# Patient Record
Sex: Male | Born: 1955 | Race: Asian | Hispanic: No | Marital: Married | Smoking: Never smoker
Health system: Southern US, Community
[De-identification: ages and names within clinical notes are randomized; demographics above are authoritative.]

## PROBLEM LIST (undated history)

## (undated) DIAGNOSIS — I10 Essential (primary) hypertension: Secondary | ICD-10-CM

## (undated) DIAGNOSIS — E039 Hypothyroidism, unspecified: Secondary | ICD-10-CM

## (undated) DIAGNOSIS — I639 Cerebral infarction, unspecified: Secondary | ICD-10-CM

## (undated) DIAGNOSIS — E119 Type 2 diabetes mellitus without complications: Secondary | ICD-10-CM

## (undated) DIAGNOSIS — E785 Hyperlipidemia, unspecified: Secondary | ICD-10-CM

## (undated) HISTORY — DX: Essential (primary) hypertension: I10

## (undated) HISTORY — DX: Hypothyroidism, unspecified: E03.9

## (undated) HISTORY — DX: Cerebral infarction, unspecified: I63.9

## (undated) HISTORY — DX: Hyperlipidemia, unspecified: E78.5

## (undated) HISTORY — DX: Type 2 diabetes mellitus without complications: E11.9

---

## 2021-02-22 ENCOUNTER — Encounter: Payer: Self-pay | Admitting: Nurse Practitioner

## 2021-02-22 ENCOUNTER — Other Ambulatory Visit: Payer: Self-pay

## 2021-02-22 ENCOUNTER — Ambulatory Visit (INDEPENDENT_AMBULATORY_CARE_PROVIDER_SITE_OTHER): Payer: Medicare Other | Admitting: Nurse Practitioner

## 2021-02-22 VITALS — BP 121/66 | HR 76 | Temp 97.5°F | Ht 60.0 in | Wt 102.0 lb

## 2021-02-22 DIAGNOSIS — I10 Essential (primary) hypertension: Secondary | ICD-10-CM | POA: Diagnosis not present

## 2021-02-22 DIAGNOSIS — Z794 Long term (current) use of insulin: Secondary | ICD-10-CM

## 2021-02-22 DIAGNOSIS — E119 Type 2 diabetes mellitus without complications: Secondary | ICD-10-CM | POA: Diagnosis not present

## 2021-02-22 DIAGNOSIS — E039 Hypothyroidism, unspecified: Secondary | ICD-10-CM

## 2021-02-22 DIAGNOSIS — E782 Mixed hyperlipidemia: Secondary | ICD-10-CM

## 2021-02-22 DIAGNOSIS — Z8673 Personal history of transient ischemic attack (TIA), and cerebral infarction without residual deficits: Secondary | ICD-10-CM

## 2021-02-22 DIAGNOSIS — Z7689 Persons encountering health services in other specified circumstances: Secondary | ICD-10-CM

## 2021-02-22 LAB — POCT GLYCOSYLATED HEMOGLOBIN (HGB A1C): Hemoglobin A1C: 11.8 % — AB (ref 4.0–5.6)

## 2021-02-22 MED ORDER — METFORMIN HCL 1000 MG PO TABS: 1000.0000 mg | ORAL_TABLET | Freq: Two times a day (BID) | ORAL | 0 refills | Status: DC

## 2021-02-22 MED ORDER — METOPROLOL TARTRATE 25 MG PO TABS: 25.0000 mg | ORAL_TABLET | Freq: Every day | ORAL | 0 refills | Status: DC

## 2021-02-22 MED ORDER — EMPAGLIFLOZIN 10 MG PO TABS: 10.0000 mg | ORAL_TABLET | Freq: Every day | ORAL | 0 refills | Status: DC

## 2021-02-22 MED ORDER — LEVOTHYROXINE SODIUM 50 MCG PO TABS: 50.0000 ug | ORAL_TABLET | Freq: Every day | ORAL | 0 refills | Status: DC

## 2021-02-22 MED ORDER — ATORVASTATIN CALCIUM 40 MG PO TABS: 40.0000 mg | ORAL_TABLET | Freq: Every day | ORAL | 0 refills | Status: DC

## 2021-02-22 MED ORDER — LOSARTAN POTASSIUM 50 MG PO TABS: 50.0000 mg | ORAL_TABLET | Freq: Every day | ORAL | 0 refills | Status: DC

## 2021-02-22 NOTE — Progress Notes (Signed)
New Patient Office Visit  Subjective:  Patient ID: Victor Copeland, male    DOB: 08-28-1956  Age: 65 y.o. MRN: [id number]  CC:  Chief Complaint  Patient presents with  . New Patient (Initial Visit)    HPI Victor Copeland presents for establishment of primary care provider. He recently was moved from [place] to this area so he could be closer to family. He was in long term nursing facility in [place]. He presents to the office with his daughter who is currently his primary care giver. She states that she is getting ready to start a new job and would like to have her father placed in new nursing care facility. He had a stroke in 2015 and does require a great deal of assistance with all of his activities of daily living. He is diabetic. He currently takes Metformin 1000mg  twice daily. He also uses regular insulin per sliding scale. His daughter states that he needs to use the sliding scale insulin with nearly every meal. She states that his blood sugars are up and down quite a bit.  Today, his HgbA1c is 11.8. The patient's daughter states that the patient is currently at his baseline and has no new concerns or complaints today. Will need to get medical records from prior PCP to review and update his chart.   Past Medical History:  Diagnosis Date  . DM (diabetes mellitus) (HCC)   . HLD (hyperlipidemia)   . HTN (hypertension)   . Hypothyroid   . Stroke [facility])     History reviewed. No pertinent surgical history.  Family History  Problem Relation Age of Onset  . Diabetes Mother     Social History   Socioeconomic History  . Marital status: Married    Spouse name: [facility]  . Number of children: 3  . Years of education: Not on file  . Highest education level: Not on file  Occupational History  . Occupation: disabled  Tobacco Use  . Smoking status: Never Smoker  . Smokeless tobacco: Never Used  Substance and Sexual Activity  . Alcohol use: Not Currently  . Drug use: Not Currently  . Sexual  activity: Not Currently  Other Topics Concern  . Not on file  Social History Narrative  . Not on file   Social Determinants of Health   Financial Resource Strain: Not on file  Food Insecurity: Not on file  Transportation Needs: Not on file  Physical Activity: Not on file  Stress: Not on file  Social Connections: Not on file  Intimate Partner Violence: Not on file    ROS Review of Systems  Endocrine:       Blood sugars have been moderately elevated. Daughter states that she is having to use sliding scale insuling prior to every meal as his blood sugars are so elevated.   Neurological: Positive for weakness.       Generalized neuromuscular weakness since suffering severe stroke several years ago.   All other systems reviewed and are negative.   Objective:   Today's Vitals   02/22/21 0909 02/22/21 0940  BP: (!) 159/91 121/66  Pulse: 72 76  Temp: (!) 97.5 F (36.4 C)   SpO2: 100%   Weight: 102 lb (46.3 kg)   Height: 5' (1.524 m)    Body mass index is 19.92 kg/m.  Physical Exam Vitals and nursing note reviewed.  Constitutional:      Appearance: Normal appearance. He is well-developed and well-nourished.  HENT:     Head:  Normocephalic.  Eyes:     Pupils: Pupils are equal, round, and reactive to light.  Neck:     Vascular: No carotid bruit.  Cardiovascular:     Rate and Rhythm: Normal rate and regular rhythm.     Pulses: Normal pulses.     Heart sounds: Normal heart sounds.  Pulmonary:     Effort: Pulmonary effort is normal.     Breath sounds: Normal breath sounds.  Abdominal:     Palpations: Abdomen is soft.     Tenderness: There is no abdominal tenderness.  Musculoskeletal:        General: Normal range of motion.     Cervical back: Normal range of motion and neck supple.  Skin:    General: Skin is warm and dry.  Neurological:     Mental Status: He is alert. Mental status is at baseline.  Psychiatric:        Mood and Affect: Mood and affect and mood  normal.     Assessment & Plan:  1. Encounter to establish care Appointment today to establish new primary care provider. Will request medical records, labs, progress notes, and imaging studies from patient's last provider in [place].   2. Type 2 diabetes mellitus without complication, with long-term current use of insulin (HCC) - POCT glycosylated hemoglobin (Hb A1C) 11.8 today. Added jardiance 10mg  daily. Continue metformin 1000mg  twice daily. Continue with regular insulin per sliding scale, prior to meals, as indicated.  - empagliflozin (JARDIANCE) 10 MG TABS tablet; Take 1 tablet (10 mg total) by mouth daily before breakfast.  Dispense: 90 tablet; Refill: 0 - metFORMIN (GLUCOPHAGE) 1000 MG tablet; Take 1 tablet (1,000 mg total) by mouth 2 (two) times daily with a meal.  Dispense: 180 tablet; Refill: 0  3. Essential hypertension Stable. Continue bp medication as prescribed.   4. Mixed hyperlipidemia Check lipid panel at next visit and adjust statin as indicated.   5. Acquired hypothyroidism Check thyroid panel at next visit and adjust levothyroxine dose as indicated   6. History of ischemic stroke Patient requiring full care 24 hours per day/7 days per week. Patient's daughter interested in obtaining placement for him in skilled nursing facility. Discussed process for that placement with patient's daughter and will fill out FL-2 paperwork when needed.    Problem List Items Addressed This Visit      Cardiovascular and Mediastinum   Essential hypertension   Relevant Medications   atorvastatin (LIPITOR) 40 MG tablet   losartan (COZAAR) 50 MG tablet   metoprolol tartrate (LOPRESSOR) 25 MG tablet     Endocrine   Type 2 diabetes mellitus without complication (HCC)   Relevant Medications   empagliflozin (JARDIANCE) 10 MG TABS tablet   atorvastatin (LIPITOR) 40 MG tablet   losartan (COZAAR) 50 MG tablet   metFORMIN (GLUCOPHAGE) 1000 MG tablet   Other Relevant Orders   POCT  glycosylated hemoglobin (Hb A1C) (Completed)   Acquired hypothyroidism   Relevant Medications   levothyroxine (SYNTHROID) 50 MCG tablet   metoprolol tartrate (LOPRESSOR) 25 MG tablet     Other   Encounter to establish care - Primary   Mixed hyperlipidemia   Relevant Medications   atorvastatin (LIPITOR) 40 MG tablet   losartan (COZAAR) 50 MG tablet   metoprolol tartrate (LOPRESSOR) 25 MG tablet   History of ischemic stroke      Outpatient Encounter Medications as of 02/22/2021  Medication Sig  . empagliflozin (JARDIANCE) 10 MG TABS tablet Take 1 tablet (10 mg total)  by mouth daily before breakfast.  . [DISCONTINUED] atorvastatin (LIPITOR) 40 MG tablet Take 40 mg by mouth daily.  . [DISCONTINUED] levothyroxine (SYNTHROID) 50 MCG tablet Take 50 mcg by mouth daily before breakfast.  . [DISCONTINUED] losartan (COZAAR) 50 MG tablet Take 50 mg by mouth daily.  . [DISCONTINUED] metFORMIN (GLUCOPHAGE) 1000 MG tablet Take 1,000 mg by mouth 2 (two) times daily with a meal.  . [DISCONTINUED] metoprolol tartrate (LOPRESSOR) 25 MG tablet Take 25 mg by mouth daily.  Marland Kitchen atorvastatin (LIPITOR) 40 MG tablet Take 1 tablet (40 mg total) by mouth daily.  Marland Kitchen levothyroxine (SYNTHROID) 50 MCG tablet Take 1 tablet (50 mcg total) by mouth daily before breakfast.  . losartan (COZAAR) 50 MG tablet Take 1 tablet (50 mg total) by mouth daily.  . metFORMIN (GLUCOPHAGE) 1000 MG tablet Take 1 tablet (1,000 mg total) by mouth 2 (two) times daily with a meal.  . metoprolol tartrate (LOPRESSOR) 25 MG tablet Take 1 tablet (25 mg total) by mouth daily.   No facility-administered encounter medications on file as of 02/22/2021.   Time spent with the patient was approximately 45 minutes. This time included reviewing progress notes, labs, imaging studies, and discussing plan for follow up.    Follow-up: Return in about 3 months (around 05/22/2021) for DM, Thyroid, Stroke.   Carlean Jews, NP

## 2021-02-22 NOTE — Patient Instructions (Signed)
Diabetes Mellitus Basics  Diabetes mellitus, or diabetes, is a long-term (chronic) disease. It occurs when the body does not properly use sugar (glucose) that is released from food after you eat. Diabetes mellitus may be caused by one or both of these problems:  Your pancreas does not make enough of a hormone called insulin.  Your body does not react in a normal way to the insulin that it makes. Insulin lets glucose enter cells in your body. This gives you energy. If you have diabetes, glucose cannot get into cells. This causes high blood glucose (hyperglycemia). How to treat and manage diabetes You may need to take insulin or other diabetes medicines daily to keep your glucose in balance. If you are prescribed insulin, you will learn how to give yourself insulin by injection. You may need to adjust the amount of insulin you take based on the foods that you eat. You will need to check your blood glucose levels using a glucose monitor as told by your health care provider. The readings can help determine if you have low or high blood glucose. Generally, you should have these blood glucose levels:  Before meals (preprandial): 80-130 mg/dL (4.4-7.2 mmol/L).  After meals (postprandial): below 180 mg/dL (10 mmol/L).  Hemoglobin A1c (HbA1c) level: less than 7%. Your health care provider will set treatment goals for you. Keep all follow-up visits. This is important. Follow these instructions at home: Diabetes medicines Take your diabetes medicines every day as told by your health care provider. List your diabetes medicines here:  Name of medicine: ______________________________ ? Amount (dose): _______________ Time (a.m./p.m.): _______________ Notes: ___________________________________  Name of medicine: ______________________________ ? Amount (dose): _______________ Time (a.m./p.m.): _______________ Notes: ___________________________________  Name of medicine:  ______________________________ ? Amount (dose): _______________ Time (a.m./p.m.): _______________ Notes: ___________________________________ Insulin If you use insulin, list the types of insulin you use here:  Insulin type: ______________________________ ? Amount (dose): _______________ Time (a.m./p.m.): _______________Notes: ___________________________________  Insulin type: ______________________________ ? Amount (dose): _______________ Time (a.m./p.m.): _______________ Notes: ___________________________________  Insulin type: ______________________________ ? Amount (dose): _______________ Time (a.m./p.m.): _______________ Notes: ___________________________________  Insulin type: ______________________________ ? Amount (dose): _______________ Time (a.m./p.m.): _______________ Notes: ___________________________________  Insulin type: ______________________________ ? Amount (dose): _______________ Time (a.m./p.m.): _______________ Notes: ___________________________________ Managing blood glucose Check your blood glucose levels using a glucose monitor as told by your health care provider. Write down the times that you check your glucose levels here:  Time: _______________ Notes: ___________________________________  Time: _______________ Notes: ___________________________________  Time: _______________ Notes: ___________________________________  Time: _______________ Notes: ___________________________________  Time: _______________ Notes: ___________________________________  Time: _______________ Notes: ___________________________________   Low blood glucose Low blood glucose (hypoglycemia) is when glucose is at or below 70 mg/dL (3.9 mmol/L). Symptoms may include:  Feeling: ? Hungry. ? Sweaty and clammy. ? Irritable or easily upset. ? Dizzy. ? Sleepy.  Having: ? A fast heartbeat. ? A headache. ? A change in your vision. ? Numbness around the mouth, lips, or  tongue.  Having trouble with: ? Moving (coordination). ? Sleeping. Treating low blood glucose To treat low blood glucose, eat or drink something containing sugar right away. If you can think clearly and swallow safely, follow the 15:15 rule:  Take 15 grams of a fast-acting carb (carbohydrate), as told by your health care provider.  Some fast-acting carbs are: ? Glucose tablets: take 3-4 tablets. ? Hard candy: eat 3-5 pieces. ? Fruit juice: drink 4 oz (120 mL). ? Regular (not diet) soda: drink 4-6 oz (120-180 mL). ? Honey or sugar:   eat 1 Tbsp (15 mL).  Check your blood glucose levels 15 minutes after you take the carb.  If your glucose is still at or below 70 mg/dL (3.9 mmol/L), take 15 grams of a carb again.  If your glucose does not go above 70 mg/dL (3.9 mmol/L) after 3 tries, get help right away.  After your glucose goes back to normal, eat a meal or a snack within 1 hour. Treating very low blood glucose If your glucose is at or below 54 mg/dL (3 mmol/L), you have very low blood glucose (severe hypoglycemia). This is an emergency. Do not wait to see if the symptoms will go away. Get medical help right away. Call your local emergency services (911 in the [place] Do not drive yourself to the hospital. Questions to ask your health care provider  Should I talk with a diabetes educator?  What equipment will I need to care for myself at home?  What diabetes medicines do I need? When should I take them?  How often do I need to check my blood glucose levels?  What number can I call if I have questions?  When is my follow-up visit?  Where can I find a support group for people with diabetes? Where to find more information  American Diabetes Association: www.diabetes.org  Association of Diabetes Care and Education Specialists: www.diabeteseducator.org Contact a health care provider if:  Your blood glucose is at or above 240 mg/dL (13.3 mmol/L) for 2 days in a row.  You have  been sick or have had a fever for 2 days or more, and you are not getting better.  You have any of these problems for more than 6 hours: ? You cannot eat or drink. ? You feel nauseous. ? You vomit. ? You have diarrhea. Get help right away if:  Your blood glucose is lower than 54 mg/dL (3 mmol/L).  You get confused.  You have trouble thinking clearly.  You have trouble breathing. These symptoms may represent a serious problem that is an emergency. Do not wait to see if the symptoms will go away. Get medical help right away. Call your local emergency services (911 in the [place] Do not drive yourself to the hospital. Summary  Diabetes mellitus is a chronic disease that occurs when the body does not properly use sugar (glucose) that is released from food after you eat.  Take insulin and diabetes medicines as told.  Check your blood glucose every day, as often as told.  Keep all follow-up visits. This is important. This information is not intended to replace advice given to you by your health care provider. Make sure you discuss any questions you have with your health care provider. Document Revised: 04/14/2020 Document Reviewed: 04/14/2020 Elsevier Patient Education  2021 Elsevier Inc.  

## 2021-02-22 NOTE — Progress Notes (Signed)
Added Jardiance 10mg  daily

## 2021-02-23 ENCOUNTER — Encounter: Payer: Self-pay | Admitting: Nurse Practitioner

## 2021-02-23 DIAGNOSIS — I10 Essential (primary) hypertension: Secondary | ICD-10-CM | POA: Insufficient documentation

## 2021-02-23 DIAGNOSIS — E119 Type 2 diabetes mellitus without complications: Secondary | ICD-10-CM | POA: Insufficient documentation

## 2021-02-23 DIAGNOSIS — Z8673 Personal history of transient ischemic attack (TIA), and cerebral infarction without residual deficits: Secondary | ICD-10-CM | POA: Insufficient documentation

## 2021-02-23 DIAGNOSIS — E039 Hypothyroidism, unspecified: Secondary | ICD-10-CM | POA: Insufficient documentation

## 2021-02-23 DIAGNOSIS — Z7689 Persons encountering health services in other specified circumstances: Secondary | ICD-10-CM | POA: Insufficient documentation

## 2021-02-23 DIAGNOSIS — E782 Mixed hyperlipidemia: Secondary | ICD-10-CM | POA: Insufficient documentation

## 2021-03-08 ENCOUNTER — Telehealth: Payer: Self-pay | Admitting: Nurse Practitioner

## 2021-03-08 NOTE — Telephone Encounter (Signed)
Patient's daughter called in asking if we can diagnose dementia for her father, please advise, thanks.

## 2021-03-09 NOTE — Telephone Encounter (Signed)
Patient recently established care with you.

## 2021-03-11 ENCOUNTER — Telehealth: Payer: Self-pay | Admitting: Nurse Practitioner

## 2021-03-11 NOTE — Telephone Encounter (Signed)
Hey. I already heard you making visit for them. Thanks.

## 2021-03-11 NOTE — Telephone Encounter (Signed)
Opened in error

## 2021-03-17 ENCOUNTER — Encounter: Payer: Self-pay | Admitting: Nurse Practitioner

## 2021-03-17 ENCOUNTER — Other Ambulatory Visit: Payer: Self-pay

## 2021-03-17 ENCOUNTER — Ambulatory Visit (INDEPENDENT_AMBULATORY_CARE_PROVIDER_SITE_OTHER): Payer: Medicare Other | Admitting: Nurse Practitioner

## 2021-03-17 VITALS — BP 134/85 | HR 64 | Temp 97.1°F | Ht 60.0 in | Wt 102.0 lb

## 2021-03-17 DIAGNOSIS — Z8673 Personal history of transient ischemic attack (TIA), and cerebral infarction without residual deficits: Secondary | ICD-10-CM | POA: Diagnosis not present

## 2021-03-17 DIAGNOSIS — F0281 Dementia in other diseases classified elsewhere with behavioral disturbance: Secondary | ICD-10-CM | POA: Diagnosis not present

## 2021-03-17 DIAGNOSIS — E119 Type 2 diabetes mellitus without complications: Secondary | ICD-10-CM | POA: Diagnosis not present

## 2021-03-17 DIAGNOSIS — R531 Weakness: Secondary | ICD-10-CM

## 2021-03-17 DIAGNOSIS — F02818 Dementia in other diseases classified elsewhere, unspecified severity, with other behavioral disturbance: Secondary | ICD-10-CM

## 2021-03-17 DIAGNOSIS — I10 Essential (primary) hypertension: Secondary | ICD-10-CM

## 2021-03-17 DIAGNOSIS — Z794 Long term (current) use of insulin: Secondary | ICD-10-CM

## 2021-03-17 MED ORDER — QUETIAPINE FUMARATE 25 MG PO TABS: ORAL_TABLET | ORAL | 2 refills | Status: DC

## 2021-03-17 NOTE — Patient Instructions (Signed)
Dementia Dementia is a condition that affects the way the brain works. It often affects memory and thinking. There are many types of dementia. Some types get worse with time and cannot be reversed. Some types of dementia include:  Alzheimer's disease. This is the most common type.  Vascular dementia. This type may happen due to a stroke.  Lewy body dementia. This type may happen to people who have Parkinson's disease.  Frontotemporal dementia. This type is caused by damage to nerve cells in certain parts of the brain. Some people may have more than one type. What are the causes? This condition is caused by damage to cells in the brain. Some causes that cannot be reversed include:  Having a condition that affects the blood vessels of the brain, such as diabetes, heart disease, or blood vessel disease.  Changes to genes. Some causes that can be reversed or slowed include:  Injury to the brain.  Certain medicines.  Infection.  Not having enough vitamin B12 in the body, or thyroid problems.  A tumor, blood clot, or too much fluid in the brain.  Certain diseases that cause your body's defense system (immune system) to attack healthy parts of the body. What are the signs or symptoms?  Problems remembering events or people.  Having trouble taking a bath or putting clothes on.  Forgetting appointments.  Forgetting to pay bills.  Trouble planning and making meals.  Having trouble speaking.  Getting lost easily.  Changes in behavior or mood. How is this treated? Treatment depends on the cause of the dementia. It might include:  Taking medicines for symptoms or to help control or slow down the dementia.  Treating the cause of your dementia. Your doctor can help you find support groups and other doctors who can help with your care. Follow these instructions at home: Medicines  Take over-the-counter and prescription medicines only as told by your doctor.  Use a pill  organizer to help you manage your medicines.  Avoidtaking medicines for pain or for sleep. Lifestyle  Make healthy choices: ? Be active as told by your doctor. ? Do not smoke or use any products that contain nicotine or tobacco. If you need help quitting, ask your doctor. ? Do not drink alcohol. ? When you get stressed, do something that will help you relax. Your doctor can give you tips. ? Spend time with other people.  Make sure you get good sleep. To get good sleep: ? Try not to take naps during the day. ? Keep your bedroom dark and cool. ? In the few hours before you go to bed, try not to do any exercise. ? Do not have foods and drinks with caffeine at night. Eating and drinking  Drink enough fluid to keep your pee (urine) pale yellow.  Eat a healthy diet. General instructions  Talk with your doctor to figure out: ? What you need help with. ? What your safety needs are.  Ask your doctor if it is safe for you to drive.  If told, wear a bracelet that tracks where you are or shows that you are a person with memory loss.  Work with your family to make big decisions.  Keep all follow-up visits.   Where to find more information  Alzheimer's Association: www.alz.org  National Institute on Aging: www.nia.nih.gov/alzheimers  World Health Organization: www.who.int Contact a doctor if:  You have any new symptoms.  Your symptoms get worse.  You have problems with swallowing or choking. Get help   right away if:  You feel very sad, or feel that you want to harm yourself.  Your family members are worried for your safety. Get help right away if you feel like you may hurt yourself or others, or have thoughts about taking your own life. Go to your nearest emergency room or:  Call your local emergency services (911 in the [place]  Call the National Suicide Prevention Lifeline at 1-800-273-8255. This is open 24 hours a day.  Text the Crisis Text Line at  741741. Summary  Dementia often affects memory and thinking.  Some types of dementia get worse with time and cannot be reversed.  Treatment for this condition depends on the cause.  Talk with your doctor to figure out what you need help with.  Your doctor can help you find support groups and other doctors who can help with your care. This information is not intended to replace advice given to you by your health care provider. Make sure you discuss any questions you have with your health care provider. Document Revised: 04/27/2020 Document Reviewed: 04/27/2020 Elsevier Patient Education  2021 Elsevier Inc.  

## 2021-03-17 NOTE — Progress Notes (Unsigned)
Established Patient Office Visit  Subjective:  Patient ID: Victor Copeland, male    DOB: 02/26/1976  Age: 65 y.o. MRN: [id number]  CC:  Chief Complaint  Patient presents with  . Dementia    HPI Victor Copeland presents for evaluation of dementia. The patient is accompanied by his daughter in the office today. She states that the patient has been doing much of his sleeping during the day. He is awake much of the night. When he wakes up, he wakes up screaming and complaining of someone touching him in his sleep. She states that the patient is frequently talking to his parents who have passed away several years ago. His daughter states that the patient is seeing things and people that are not in the room with him. The patient's daughter states that the patient is also violent to her and her mother when the patient wakes up during the night. He does require strong assistance when going to the bathroom or doing most activities in the home. When waking up from sleeping, the patient's daughter states that the patient will often kick at and hit her and her mother. The patient and his family recently moved to [place] from [place]. The patient's daughter states that the patient was in long term care facility. He had room in dementia unit. The nurses on the unit suspect that the patient had dementia, however, an official diagnosis was never made. The patient's daughter is trying to have the patient placed in a skilled nursing facility here in [place]. She has found a facility that accepts his insurance. Placement is on a dementia/memory care unit. In order for him to receive placement there, the patient does require a formal diagnosis of dementia. He had a stroke in 2015 and does require a great deal of assistance with all of his activities of daily living.   MMSE - Mini Mental State Exam 03/18/2021  Orientation to time 3  Orientation to Place 3  Registration 3  Attention/ Calculation 1  Recall 1   Language- name 2 objects 2  Language- repeat 1  Language- follow 3 step command 3  Language- read & follow direction 1  Write a sentence 0  Copy design 0  Total score 18    Mini-Cog - 03/18/21 0844    Normal clock drawing test? no    How many words correct? 0           Past Medical History:  Diagnosis Date  . DM (diabetes mellitus) ([place])   . HLD (hyperlipidemia)   . HTN (hypertension)   . Hypothyroid   . Stroke [facility])     History reviewed. No pertinent surgical history.  Family History  Problem Relation Age of Onset  . Diabetes Mother     Social History   Socioeconomic History  . Marital status: Married    Spouse name: Zollie Scale  . Number of children: 3  . Years of education: Not on file  . Highest education level: Not on file  Occupational History  . Occupation: disabled  Tobacco Use  . Smoking status: Never Smoker  . Smokeless tobacco: Never Used  Substance and Sexual Activity  . Alcohol use: Not Currently  . Drug use: Not Currently  . Sexual activity: Not Currently  Other Topics Concern  . Not on file  Social History Narrative  . Not on file   Social Determinants of Health   Financial Resource Strain: Not on file  Food Insecurity: Not on file  Transportation Needs: Not on file  Physical Activity: Not on file  Stress: Not on file  Social Connections: Not on file  Intimate Partner Violence: Not on file    Outpatient Medications Prior to Visit  Medication Sig Dispense Refill  . atorvastatin (LIPITOR) 40 MG tablet Take 1 tablet (40 mg total) by mouth daily. 90 tablet 0  . empagliflozin (JARDIANCE) 10 MG TABS tablet Take 1 tablet (10 mg total) by mouth daily before breakfast. 90 tablet 0  . levothyroxine (SYNTHROID) 50 MCG tablet Take 1 tablet (50 mcg total) by mouth daily before breakfast. 90 tablet 0  . losartan (COZAAR) 50 MG tablet Take 1 tablet (50 mg total) by mouth daily. 90 tablet 0  . metFORMIN (GLUCOPHAGE) 1000 MG tablet Take 1 tablet  (1,000 mg total) by mouth 2 (two) times daily with a meal. 180 tablet 0  . metoprolol tartrate (LOPRESSOR) 25 MG tablet Take 1 tablet (25 mg total) by mouth daily. 90 tablet 0   No facility-administered medications prior to visit.    No Known Allergies  ROS Review of Systems  Constitutional: Negative for activity change, appetite change, chills and fever.  HENT: Negative for congestion and sinus pain.   Eyes: Negative.   Respiratory: Negative for cough and wheezing.   Cardiovascular: Negative for chest pain and palpitations.  Gastrointestinal: Negative for constipation, diarrhea, nausea and vomiting.  Endocrine:       Patient's daughter states that blood sugars are still running high but they are slightly improved from last visit.   Musculoskeletal: Positive for gait problem. Negative for back pain and myalgias.  Skin: Negative for rash.  Neurological: Positive for speech difficulty and weakness. Negative for dizziness and headaches.  Psychiatric/Behavioral: Positive for behavioral problems, confusion, hallucinations and sleep disturbance. The patient is not nervous/anxious.   All other systems reviewed and are negative.     Objective:    Physical Exam Vitals and nursing note reviewed.  Constitutional:      General: He is awake.     Appearance: Normal appearance. He is well-developed and normal weight.     Comments: The patient is in wheelchair in the office  HENT:     Head: Normocephalic and atraumatic.  Eyes:     Pupils: Pupils are equal, round, and reactive to light.  Neck:     Vascular: No carotid bruit.  Cardiovascular:     Rate and Rhythm: Normal rate and regular rhythm.     Pulses: Normal pulses.     Heart sounds: Normal heart sounds.  Pulmonary:     Effort: Pulmonary effort is normal.     Breath sounds: Normal breath sounds.  Abdominal:     Palpations: Abdomen is soft.     Tenderness: There is no abdominal tenderness.  Musculoskeletal:        General: Normal  range of motion.     Cervical back: Normal range of motion and neck supple.     Comments: Test of grip strength shows left arm/hand slightly weaker then right.   Skin:    General: Skin is warm and dry.  Neurological:     Mental Status: He is alert. Mental status is at baseline. He is disoriented.     Sensory: Sensation is intact.     Motor: Weakness present.     Coordination: Coordination abnormal. Finger-Nose-Finger Test abnormal.     Gait: Gait abnormal.     Comments: The patient is awake. Able to answer some yes and no questions  appropriately. There is deficit of both recent and remote memory. Patient is able to follow simple commands. Grip strength diminished on the left side. Strength of right lower extremity is diminished. Patient unable to walk independently. Even with use of a walker, he requires a great deal of assistance, both in front and behind him.   Psychiatric:        Mood and Affect: Mood normal.        Speech: Speech is delayed.        Behavior: Behavior is cooperative.        Thought Content: Thought content normal.        Cognition and Memory: Cognition is impaired. Memory is impaired. He exhibits impaired recent memory and impaired remote memory.     Comments: Patient's daughter reports that patient is having visual hallucinations, especially at night.      Today's Vitals   03/17/21 1553  BP: 134/85  Pulse: 64  Temp: (!) 97.1 F (36.2 C)  SpO2: 100%  Weight: 102 lb (46.3 kg)  Height: 5' (1.524 m)   Body mass index is 19.92 kg/m.    Wt Readings from Last 3 Encounters:  03/17/21 102 lb (46.3 kg)  02/22/21 102 lb (46.3 kg)     Health Maintenance Due  Topic Date Due  . Hepatitis C Screening  Never done  . PNEUMOCOCCAL POLYSACCHARIDE VACCINE AGE 64-64 HIGH RISK  Never done  . COVID-19 Vaccine (1) Never done  . FOOT EXAM  Never done  . OPHTHALMOLOGY EXAM  Never done  . HIV Screening  Never done  . TETANUS/TDAP  Never done  . COLONOSCOPY (Pts 45-56yr  Insurance coverage will need to be confirmed)  Never done  . INFLUENZA VACCINE  Never done    There are no preventive care reminders to display for this patient.  No results found for: TSH No results found for: WBC, HGB, HCT, MCV, PLT No results found for: NA, K, CHLORIDE, CO2, GLUCOSE, BUN, CREATININE, BILITOT, ALKPHOS, AST, ALT, PROT, ALBUMIN, CALCIUM, ANIONGAP, EGFR, GFR No results found for: CHOL No results found for: HDL No results found for: LDLCALC No results found for: TRIG No results found for: CHOLHDL Lab Results  Component Value Date   HGBA1C 11.8 (A) 02/22/2021      Assessment & Plan:  1. Dementia associated with other underlying disease with behavioral disturbance ([facility] The patient presents with cognitive and memory deficits. Memory deficit effecting both recent and remote memory. Reports visual hallucinations especially at night. Trial of seroquel 253mat bedtime. Advised patient's daughter to start with 1/2 tablet at bedtime if needed. May increase to 1 tablet at bedtime as needed and as tolerated. Patient requiring full care 24 hours per day/7 days per week. Patient's daughter interested in obtaining placement for him in skilled nursing facility. Discussed process for that placement with patient's daughter and will fill out FL-2 paperwork when needed.  - QUEtiapine (SEROQUEL) 25 MG tablet; Take 1/2 to 1 tablet at bedtime as needed  Dispense: 30 tablet; Refill: 2  2. Generalized weakness Patient requiring full care 24 hours per day/7 days per week. Patient's daughter interested in obtaining placement for him in skilled nursing facility. Discussed process for that placement with patient's daughter and will fill out FL-2 paperwork when needed.   3. History of ischemic stroke Likely causative factor in patient's dementia and generalized weakness.   4. Type 2 diabetes mellitus without complication, with long-term current use of insulin (HCC) Continue oral medications as  prescribed. Use sliding scale insulin as needed and as prescribed   5. Essential hypertension Blood pressure improved today. Will continue to monitor.   Problem List Items Addressed This Visit      Cardiovascular and Mediastinum   Essential hypertension     Endocrine   Type 2 diabetes mellitus without complication (HCC)     Nervous and Auditory   Dementia associated with other underlying disease with behavioral disturbance (HCC) - Primary   Relevant Medications   QUEtiapine (SEROQUEL) 25 MG tablet     Other   History of ischemic stroke   Generalized weakness      Meds ordered this encounter  Medications  . QUEtiapine (SEROQUEL) 25 MG tablet    Sig: Take 1/2 to 1 tablet at bedtime as needed    Dispense:  30 tablet    Refill:  2    Order Specific Question:   Supervising Provider    Answer:   Beatrice Lecher D [[id number]]   Time spent with the patient was approximately 45 minutes. This time included reviewing progress notes, labs, imaging studies, and discussing plan for follow up.   Follow-up: Return for as scheduled. patient's daughter may bring paperwork from long term care facility before this. Marland Kitchen    Ronnell Freshwater, NP

## 2021-03-18 DIAGNOSIS — F0281 Dementia in other diseases classified elsewhere with behavioral disturbance: Secondary | ICD-10-CM | POA: Insufficient documentation

## 2021-03-18 DIAGNOSIS — R531 Weakness: Secondary | ICD-10-CM | POA: Insufficient documentation

## 2021-03-18 DIAGNOSIS — F02818 Dementia in other diseases classified elsewhere, unspecified severity, with other behavioral disturbance: Secondary | ICD-10-CM | POA: Insufficient documentation

## 2021-04-01 ENCOUNTER — Telehealth: Payer: Self-pay | Admitting: Nurse Practitioner

## 2021-04-10 ENCOUNTER — Emergency Department (HOSPITAL_COMMUNITY): Payer: Medicare Other

## 2021-04-10 ENCOUNTER — Inpatient Hospital Stay (HOSPITAL_COMMUNITY)
Admission: EM | Admit: 2021-04-10 | Discharge: 2021-04-12 | DRG: 690 | Disposition: A | Discharge status: Home Health | Payer: Medicare Other | Attending: Internal Medicine | Admitting: Internal Medicine

## 2021-04-10 ENCOUNTER — Encounter (HOSPITAL_COMMUNITY): Payer: Self-pay | Admitting: Emergency Medicine

## 2021-04-10 ENCOUNTER — Other Ambulatory Visit: Payer: Self-pay

## 2021-04-10 DIAGNOSIS — N390 Urinary tract infection, site not specified: Secondary | ICD-10-CM | POA: Diagnosis present

## 2021-04-10 DIAGNOSIS — R531 Weakness: Secondary | ICD-10-CM

## 2021-04-10 DIAGNOSIS — E785 Hyperlipidemia, unspecified: Secondary | ICD-10-CM | POA: Diagnosis present

## 2021-04-10 DIAGNOSIS — R319 Hematuria, unspecified: Secondary | ICD-10-CM

## 2021-04-10 DIAGNOSIS — E119 Type 2 diabetes mellitus without complications: Secondary | ICD-10-CM

## 2021-04-10 DIAGNOSIS — I69398 Other sequelae of cerebral infarction: Secondary | ICD-10-CM | POA: Diagnosis not present

## 2021-04-10 DIAGNOSIS — G9349 Other encephalopathy: Secondary | ICD-10-CM | POA: Diagnosis present

## 2021-04-10 DIAGNOSIS — N179 Acute kidney failure, unspecified: Secondary | ICD-10-CM | POA: Diagnosis present

## 2021-04-10 DIAGNOSIS — E782 Mixed hyperlipidemia: Secondary | ICD-10-CM | POA: Diagnosis present

## 2021-04-10 DIAGNOSIS — Z833 Family history of diabetes mellitus: Secondary | ICD-10-CM

## 2021-04-10 DIAGNOSIS — I10 Essential (primary) hypertension: Secondary | ICD-10-CM | POA: Diagnosis present

## 2021-04-10 DIAGNOSIS — Z7984 Long term (current) use of oral hypoglycemic drugs: Secondary | ICD-10-CM

## 2021-04-10 DIAGNOSIS — E039 Hypothyroidism, unspecified: Secondary | ICD-10-CM | POA: Diagnosis present

## 2021-04-10 DIAGNOSIS — Z20822 Contact with and (suspected) exposure to covid-19: Secondary | ICD-10-CM | POA: Diagnosis present

## 2021-04-10 DIAGNOSIS — N3001 Acute cystitis with hematuria: Secondary | ICD-10-CM | POA: Diagnosis present

## 2021-04-10 DIAGNOSIS — R112 Nausea with vomiting, unspecified: Secondary | ICD-10-CM | POA: Diagnosis present

## 2021-04-10 DIAGNOSIS — Z79899 Other long term (current) drug therapy: Secondary | ICD-10-CM | POA: Diagnosis not present

## 2021-04-10 DIAGNOSIS — Z79890 Hormone replacement therapy (postmenopausal): Secondary | ICD-10-CM | POA: Diagnosis not present

## 2021-04-10 DIAGNOSIS — F0280 Dementia in other diseases classified elsewhere without behavioral disturbance: Secondary | ICD-10-CM | POA: Diagnosis present

## 2021-04-10 DIAGNOSIS — F02818 Dementia in other diseases classified elsewhere, unspecified severity, with other behavioral disturbance: Secondary | ICD-10-CM

## 2021-04-10 DIAGNOSIS — Z8673 Personal history of transient ischemic attack (TIA), and cerebral infarction without residual deficits: Secondary | ICD-10-CM

## 2021-04-10 DIAGNOSIS — D649 Anemia, unspecified: Secondary | ICD-10-CM | POA: Diagnosis present

## 2021-04-10 DIAGNOSIS — F0281 Dementia in other diseases classified elsewhere with behavioral disturbance: Secondary | ICD-10-CM | POA: Diagnosis not present

## 2021-04-10 LAB — URINALYSIS, ROUTINE W REFLEX MICROSCOPIC
Bacteria, UA: NONE SEEN
Bilirubin Urine: NEGATIVE
Glucose, UA: >=500 mg/dL — AB
Ketones, ur: 20 mg/dL — AB
Nitrite: POSITIVE — AB
Protein, ur: 100 mg/dL — AB
RBC / HPF: >50 RBC/hpf — ABNORMAL HIGH (ref 0–5)
Specific Gravity, Urine: 1.042 — ABNORMAL HIGH (ref 1.005–1.030)
WBC, UA: >50 WBC/hpf — ABNORMAL HIGH (ref 0–5)
pH: 5 (ref 5.0–8.0)

## 2021-04-10 LAB — CBC
HCT: 37.4 % — ABNORMAL LOW (ref 39.0–52.0)
Hemoglobin: 12.8 g/dL — ABNORMAL LOW (ref 13.0–17.0)
MCH: 29.3 pg (ref 26.0–34.0)
MCHC: 34.2 g/dL (ref 30.0–36.0)
MCV: 85.6 fL (ref 80.0–100.0)
Platelets: 302 10*3/uL (ref 150–400)
RBC: 4.37 MIL/uL (ref 4.22–5.81)
RDW: 13.5 % (ref 11.5–15.5)
WBC: 11.4 10*3/uL — ABNORMAL HIGH (ref 4.0–10.5)
nRBC: 0 % (ref 0.0–0.2)

## 2021-04-10 LAB — BASIC METABOLIC PANEL
Anion gap: 14 (ref 5–15)
BUN: 31 mg/dL — ABNORMAL HIGH (ref 8–23)
CO2: 20 mmol/L — ABNORMAL LOW (ref 22–32)
Calcium: 8.7 mg/dL — ABNORMAL LOW (ref 8.9–10.3)
Chloride: 103 mmol/L (ref 98–111)
Creatinine, Ser: 1.54 mg/dL — ABNORMAL HIGH (ref 0.61–1.24)
GFR, Estimated: 49 mL/min — ABNORMAL LOW (ref >60)
Glucose, Bld: 299 mg/dL — ABNORMAL HIGH (ref 70–99)
Potassium: 4.9 mmol/L (ref 3.5–5.1)
Sodium: 137 mmol/L (ref 135–145)

## 2021-04-10 LAB — RESP PANEL BY RT-PCR (FLU A&B, COVID) ARPGX2
Influenza A by PCR: NEGATIVE
Influenza B by PCR: NEGATIVE
SARS Coronavirus 2 by RT PCR: NEGATIVE

## 2021-04-10 LAB — CBG MONITORING, ED: Glucose-Capillary: 279 mg/dL — ABNORMAL HIGH (ref 70–99)

## 2021-04-10 LAB — TROPONIN I (HIGH SENSITIVITY)
Troponin I (High Sensitivity): 5 ng/L (ref <18)
Troponin I (High Sensitivity): 8 ng/L (ref <18)

## 2021-04-10 LAB — GLUCOSE, CAPILLARY: Glucose-Capillary: 214 mg/dL — ABNORMAL HIGH (ref 70–99)

## 2021-04-10 LAB — HEPATIC FUNCTION PANEL
ALT: 22 U/L (ref 0–44)
AST: 19 U/L (ref 15–41)
Albumin: 3.3 g/dL — ABNORMAL LOW (ref 3.5–5.0)
Alkaline Phosphatase: 78 U/L (ref 38–126)
Bilirubin, Direct: 0.1 mg/dL (ref 0.0–0.2)
Indirect Bilirubin: 0.8 mg/dL (ref 0.3–0.9)
Total Bilirubin: 0.9 mg/dL (ref 0.3–1.2)
Total Protein: 7 g/dL (ref 6.5–8.1)

## 2021-04-10 LAB — LIPASE, BLOOD: Lipase: 23 U/L (ref 11–51)

## 2021-04-10 MED ORDER — ONDANSETRON HCL 4 MG/2ML IJ SOLN
4.0000 mg | Freq: Once | INTRAMUSCULAR | Status: AC
  Administered 2021-04-10: 4 mg via INTRAVENOUS
  Filled 2021-04-10: qty 2

## 2021-04-10 MED ORDER — ATORVASTATIN CALCIUM 40 MG PO TABS
40.0000 mg | ORAL_TABLET | Freq: Every day | ORAL | Status: DC
  Administered 2021-04-11 – 2021-04-12 (×2): 40 mg via ORAL
  Filled 2021-04-10 (×2): qty 1

## 2021-04-10 MED ORDER — INSULIN ASPART 100 UNIT/ML ~~LOC~~ SOLN
0.0000 [IU] | Freq: Three times a day (TID) | SUBCUTANEOUS | Status: DC
  Administered 2021-04-11: 2 [IU] via SUBCUTANEOUS
  Administered 2021-04-11 – 2021-04-12 (×2): 1 [IU] via SUBCUTANEOUS
  Administered 2021-04-12: 9 [IU] via SUBCUTANEOUS

## 2021-04-10 MED ORDER — QUETIAPINE FUMARATE 25 MG PO TABS
25.0000 mg | ORAL_TABLET | Freq: Every day | ORAL | Status: DC
  Administered 2021-04-10 – 2021-04-11 (×2): 25 mg via ORAL
  Filled 2021-04-10 (×2): qty 1

## 2021-04-10 MED ORDER — IOHEXOL 300 MG/ML  SOLN
75.0000 mL | Freq: Once | INTRAMUSCULAR | Status: AC | PRN
  Administered 2021-04-10: 75 mL via INTRAVENOUS

## 2021-04-10 MED ORDER — SODIUM CHLORIDE 0.9 % IV SOLN
1.0000 g | Freq: Once | INTRAVENOUS | Status: AC
  Administered 2021-04-10: 1 g via INTRAVENOUS
  Filled 2021-04-10: qty 10

## 2021-04-10 MED ORDER — TRAMADOL HCL 50 MG PO TABS
50.0000 mg | ORAL_TABLET | Freq: Once | ORAL | Status: AC
  Administered 2021-04-10: 50 mg via ORAL
  Filled 2021-04-10: qty 1

## 2021-04-10 MED ORDER — SODIUM CHLORIDE 0.9 % IV SOLN: INTRAVENOUS | Status: AC

## 2021-04-10 MED ORDER — INSULIN ASPART 100 UNIT/ML ~~LOC~~ SOLN
0.0000 [IU] | Freq: Every day | SUBCUTANEOUS | Status: DC
  Administered 2021-04-10: 2 [IU] via SUBCUTANEOUS

## 2021-04-10 MED ORDER — SODIUM CHLORIDE 0.9 % IV SOLN
1.0000 g | INTRAVENOUS | Status: DC
  Administered 2021-04-11: 1 g via INTRAVENOUS
  Filled 2021-04-10: qty 1
  Filled 2021-04-10: qty 10

## 2021-04-10 MED ORDER — SODIUM CHLORIDE 0.9 % IV BOLUS
1000.0000 mL | Freq: Once | INTRAVENOUS | Status: AC
  Administered 2021-04-10: 1000 mL via INTRAVENOUS

## 2021-04-10 MED ORDER — LEVOTHYROXINE SODIUM 50 MCG PO TABS
50.0000 ug | ORAL_TABLET | Freq: Every day | ORAL | Status: DC
  Administered 2021-04-11 – 2021-04-12 (×2): 50 ug via ORAL
  Filled 2021-04-10 (×2): qty 1

## 2021-04-10 MED ORDER — METOPROLOL TARTRATE 25 MG PO TABS
25.0000 mg | ORAL_TABLET | Freq: Every day | ORAL | Status: DC
  Administered 2021-04-11 – 2021-04-12 (×2): 25 mg via ORAL
  Filled 2021-04-10 (×2): qty 1

## 2021-04-10 MED ORDER — ONDANSETRON HCL 4 MG/2ML IJ SOLN
4.0000 mg | Freq: Four times a day (QID) | INTRAMUSCULAR | Status: DC | PRN
  Administered 2021-04-10: 4 mg via INTRAVENOUS
  Filled 2021-04-10: qty 2

## 2021-04-10 MED ORDER — METOCLOPRAMIDE HCL 5 MG/ML IJ SOLN
10.0000 mg | Freq: Once | INTRAMUSCULAR | Status: AC
  Administered 2021-04-10: 10 mg via INTRAVENOUS
  Filled 2021-04-10: qty 2

## 2021-04-10 MED ORDER — ENOXAPARIN SODIUM 40 MG/0.4ML ~~LOC~~ SOLN
40.0000 mg | SUBCUTANEOUS | Status: DC
  Administered 2021-04-10 – 2021-04-11 (×2): 40 mg via SUBCUTANEOUS
  Filled 2021-04-10 (×2): qty 0.4

## 2021-04-10 NOTE — ED Provider Notes (Signed)
[facility]   CSN: [id number] Arrival date & time: 04/10/21  1254     History Chief Complaint  Patient presents with  . Weakness  . Emesis    Victor Copeland is a 65 y.o. male.  Level 5 caveat secondary to dementia and language barrier.  Patient speaks Hmong.  Try to communicate with patient via iPad interpreter with no success.  Report from EMS is COVID vaccination 8 days ago and has been feeling weak since then.  Nausea and vomiting today.  Patient shakes his head no when asked if he is having pain.  Unable to provide any other history.  The history is provided by the patient. The history is limited by a language barrier and the condition of the patient.  Weakness Severity:  Unable to specify Timing:  Unable to specify Progression:  Unchanged Chronicity:  New Relieved by:  None tried Worsened by:  Nothing Ineffective treatments:  None tried Associated symptoms: nausea and vomiting   Associated symptoms: no abdominal pain and no chest pain   Emesis Associated symptoms: no abdominal pain        Past Medical History:  Diagnosis Date  . DM (diabetes mellitus) (HCC)   . HLD (hyperlipidemia)   . HTN (hypertension)   . Hypothyroid   . Stroke [facility])     Patient Active Problem List   Diagnosis Date Noted  . Dementia associated with other underlying disease with behavioral disturbance (HCC) 03/18/2021  . Generalized weakness 03/18/2021  . Encounter to establish care 02/23/2021  . Type 2 diabetes mellitus without complication (HCC) 02/23/2021  . Essential hypertension 02/23/2021  . Mixed hyperlipidemia 02/23/2021  . Acquired hypothyroidism 02/23/2021  . History of ischemic stroke 02/23/2021    History reviewed. No pertinent surgical history.     Family History  Problem Relation Age of Onset  . Diabetes Mother     Social History   Tobacco Use  . Smoking status: Never Smoker  . Smokeless tobacco: Never Used  Substance  Use Topics  . Alcohol use: Not Currently  . Drug use: Not Currently    Home Medications Prior to Admission medications   Medication Sig Start Date End Date Taking? Authorizing Provider  atorvastatin (LIPITOR) 40 MG tablet Take 1 tablet (40 mg total) by mouth daily. 02/22/21   Carlean Jews, NP  empagliflozin (JARDIANCE) 10 MG TABS tablet Take 1 tablet (10 mg total) by mouth daily before breakfast. 02/22/21   Carlean Jews, NP  levothyroxine (SYNTHROID) 50 MCG tablet Take 1 tablet (50 mcg total) by mouth daily before breakfast. 02/22/21   Carlean Jews, NP  losartan (COZAAR) 50 MG tablet Take 1 tablet (50 mg total) by mouth daily. 02/22/21   Carlean Jews, NP  metFORMIN (GLUCOPHAGE) 1000 MG tablet Take 1 tablet (1,000 mg total) by mouth 2 (two) times daily with a meal. 02/22/21   Carlean Jews, NP  metoprolol tartrate (LOPRESSOR) 25 MG tablet Take 1 tablet (25 mg total) by mouth daily. 02/22/21   Carlean Jews, NP  QUEtiapine (SEROQUEL) 25 MG tablet Take 1/2 to 1 tablet at bedtime as needed 03/17/21   Carlean Jews, NP    Allergies    Patient has no known allergies.  Review of Systems   Review of Systems  Unable to perform ROS: Dementia  Cardiovascular: Negative for chest pain.  Gastrointestinal: Positive for nausea and vomiting. Negative for abdominal pain.  Neurological: Positive for weakness.  Physical Exam Updated Vital Signs BP (!) 160/81 (BP Location: Left Arm)   Pulse 83   Temp 98 F (36.7 C) (Oral)   Resp 10   SpO2 100%   Physical Exam Vitals and nursing note reviewed.  Constitutional:      Appearance: Normal appearance. He is well-developed.  HENT:     Head: Normocephalic and atraumatic.  Eyes:     Conjunctiva/sclera: Conjunctivae normal.  Cardiovascular:     Rate and Rhythm: Normal rate and regular rhythm.     Heart sounds: No murmur heard.   Pulmonary:     Effort: Pulmonary effort is normal. No respiratory distress.     Breath  sounds: Normal breath sounds.  Abdominal:     Palpations: Abdomen is soft.     Tenderness: There is no abdominal tenderness. There is no guarding or rebound.  Musculoskeletal:        General: No deformity or signs of injury. Normal range of motion.     Cervical back: Neck supple.  Skin:    General: Skin is warm and dry.  Neurological:     Mental Status: He is alert.     Motor: Weakness present.     Comments: Patient is awake and unable to give any history.  He says no when I asked if he had any pain.  He is able to move his arms and his legs but does have some general weakness.  Per the daughter he uses a walker but is still very unsteady with that and they have to assist him     ED Results / Procedures / Treatments   Labs (all labs ordered are listed, but only abnormal results are displayed) Labs Reviewed  BASIC METABOLIC PANEL - Abnormal; Notable for the following components:      Result Value   CO2 20 (*)    Glucose, Bld 299 (*)    BUN 31 (*)    Creatinine, Ser 1.54 (*)    Calcium 8.7 (*)    GFR, Estimated 49 (*)    All other components within normal limits  CBC - Abnormal; Notable for the following components:   WBC 11.4 (*)    Hemoglobin 12.8 (*)    HCT 37.4 (*)    All other components within normal limits  URINALYSIS, ROUTINE W REFLEX MICROSCOPIC - Abnormal; Notable for the following components:   Specific Gravity, Urine 1.042 (*)    Glucose, UA >=500 (*)    Hgb urine dipstick LARGE (*)    Ketones, ur 20 (*)    Protein, ur 100 (*)    Nitrite POSITIVE (*)    Leukocytes,Ua MODERATE (*)    RBC / HPF >50 (*)    WBC, UA >50 (*)    All other components within normal limits  HEPATIC FUNCTION PANEL - Abnormal; Notable for the following components:   Albumin 3.3 (*)    All other components within normal limits  CBG MONITORING, ED - Abnormal; Notable for the following components:   Glucose-Capillary 279 (*)    All other components within normal limits  URINE CULTURE   RESP PANEL BY RT-PCR (FLU A&B, COVID) ARPGX2  LIPASE, BLOOD  TROPONIN I (HIGH SENSITIVITY)  TROPONIN I (HIGH SENSITIVITY)    EKG EKG Interpretation  Date/Time:  Saturday April 10 2021 13:20:08 EDT Ventricular Rate:  84 PR Interval:  165 QRS Duration: 204 QT Interval:  367 QTC Calculation: 434 R Axis:   48 Text Interpretation: Sinus rhythm No old tracing  to compare Confirmed by Meridee Score [phone]) on 04/10/2021 1:22:58 PM   Radiology CT Abdomen Pelvis W Contrast  Result Date: 04/10/2021 CLINICAL DATA:  Weakness and vomiting. EXAM: CT ABDOMEN AND PELVIS WITH CONTRAST TECHNIQUE: Multidetector CT imaging of the abdomen and pelvis was performed using the standard protocol following bolus administration of intravenous contrast. CONTRAST:  3mL OMNIPAQUE IOHEXOL 300 MG/ML  SOLN COMPARISON:  75 mL omni 300 FINDINGS: Lower chest: No acute abnormality. Hepatobiliary: No focal liver abnormality is seen. No gallstones, gallbladder wall thickening, or biliary dilatation. Pancreas: Unremarkable. No pancreatic ductal dilatation or surrounding inflammatory changes. Spleen: Normal in size without focal abnormality. Adrenals/Urinary Tract: Adrenal glands are unremarkable. Kidneys are symmetric in size. No renal calculi or hydronephrosis. No mass lesion. Urinary bladder appears thick-walled. Stomach/Bowel: Stomach is within normal limits. Appendix appears normal. No evidence of bowel wall thickening, distention, or inflammatory changes. Vascular/Lymphatic: Aortic atherosclerosis. No enlarged abdominal or pelvic lymph nodes. Reproductive: Prostate is unremarkable. Other: No abdominal wall hernia or abnormality. No abdominopelvic ascites. Musculoskeletal: No acute or significant osseous findings. IMPRESSION: 1. Urinary bladder appears thick-walled. Recommend correlation with urinalysis to exclude cystitis. 2. Aortic atherosclerosis. Aortic Atherosclerosis (ICD10-I70.0). Electronically Signed   By: Emmaline Kluver M.D.   On: 04/10/2021 17:45   DG Chest Port 1 View  Result Date: 04/10/2021 CLINICAL DATA:  Weakness since receiving COVID vaccine 8 days ago. EXAM: PORTABLE CHEST 1 VIEW COMPARISON:  None. FINDINGS: The heart size and mediastinal contours are within normal limits. Both lungs are clear. The visualized skeletal structures are unremarkable. IMPRESSION: No active disease. Electronically Signed   By: Sherian Rein M.D.   On: 04/10/2021 14:05    Procedures Procedures   Medications Ordered in ED Medications  cefTRIAXone (ROCEPHIN) 1 g in sodium chloride 0.9 % 100 mL IVPB (has no administration in time range)  sodium chloride 0.9 % bolus 1,000 mL (0 mLs Intravenous Stopped 04/10/21 1554)  ondansetron (ZOFRAN) injection 4 mg (4 mg Intravenous Given 04/10/21 1345)  ondansetron (ZOFRAN) injection 4 mg (4 mg Intravenous Given 04/10/21 1654)  metoCLOPramide (REGLAN) injection 10 mg (10 mg Intravenous Given 04/10/21 1740)  iohexol (OMNIPAQUE) 300 MG/ML solution 75 mL (75 mLs Intravenous Contrast Given 04/10/21 1733)  sodium chloride 0.9 % bolus 1,000 mL (1,000 mLs Intravenous New Bag/Given 04/10/21 1829)    ED Course  I have reviewed the triage vital signs and the nursing notes.  Pertinent labs & imaging results that were available during my care of the patient were reviewed by me and considered in my medical decision making (see chart for details).  Clinical Course as of 04/11/21 7673  Sat Apr 10, 2021  1350 Patient's family member here now and is able to provide a little bit better history.  He does have a history of dementia and they recently moved here from [place].  He got his first COVID-vaccine 5 days ago and is felt nauseous since then.  Last 2 days he has been vomiting.  He denies any pain.  There is no reported fever. [MB]  1458 Creatinine elevated at 1.54 unclear baseline.  Hemoglobin low at 12.8 unclear baseline.  Blood sugar is elevated and nondiabetic.  Troponin unremarkable. [MB]   2015 Discussed with Dr. Antionette Char from [facility] hospitalist who will evaluate patient for admission. [MB]    Clinical Course User Index [MB] Terrilee Files, MD   MDM Rules/Calculators/A&P  This patient complains of weakness nausea vomiting dementia; this involves an extensive number of treatment Options and is a complaint that carries with it a high risk of complications and Morbidity. The differential includes worsening dementia, metabolic derangement, flexion, pneumonia, COVID UTI, renal failure  I ordered, reviewed and interpreted labs, which included CBC with elevated white count, low hemoglobin unclear baseline, chemistries with elevated creatinine low bicarb consistent with some dehydration, urinalysis with infected nitrite positive I ordered medication IV fluids IV antibiotics nausea medication I ordered imaging studies which included chest x-ray and CT abdomen and pelvis and I independently    visualized and interpreted imaging which showed bladder thickening Additional history obtained from patient's daughter Previous records obtained and reviewed in epic including recent PCP visit I consulted Dr. Antionette Char [facility] hospitalist and discussed lab and imaging findings  Critical Interventions: None  After the interventions stated above, I reevaluated the patient and found patient still to be symptomatic.  He will need to be admitted to the hospital for further hydration and IV antibiotics.  Also consideration for social work consult for possible placement   Final Clinical Impression(s) / ED Diagnoses Final diagnoses:  Generalized weakness  Nausea and vomiting, intractability of vomiting not specified, unspecified vomiting type  Urinary tract infection with hematuria, site unspecified    Rx / DC Orders ED Discharge Orders    None       Terrilee Files, MD 04/11/21 [phone]

## 2021-04-10 NOTE — ED Triage Notes (Signed)
Family reports patient received covid vaccination 8 days ago and "has been weak since". Reports weakness and n/v today. Hx of dementia. Speaks Hmong.

## 2021-04-10 NOTE — ED Notes (Signed)
Spoke with daughter reports that patient has never had a CT scan done but is not allergic to contrast dye or anything.

## 2021-04-10 NOTE — Progress Notes (Signed)
Patient has a headache. PCP has been notified for pain medication.

## 2021-04-10 NOTE — H&P (Signed)
History and Physical    Romey Mathieson ASN:[id number] DOB: 12-Jun-1956 DOA: 04/10/2021  PCP: Carlean Jews, NP   Patient coming from: Home   Chief Complaint: Lethargy, decreased oral intake, N/V   HPI: Sheron Robin is a 65 y.o. male with medical history significant for hypertension, type 2 diabetes mellitus, dementia, hypothyroidism, and history of CVA with right-sided motor deficits, presenting to the emergency department for evaluation of decreased appetite, lethargy, and nausea with nonbloody vomiting.  History is obtained from the patient's daughter who notes that the patient had been in a memory care unit in [place] but has been at home since they recently moved to this area.  He had some increased fatigue and poor appetite 8 days ago after receiving COVID vaccination, initially seem to be improving, but then marked progression in fatigue, loss of appetite, also nausea with nonbloody vomiting over the past few days.  ED Course: Upon arrival to the ED, patient is found to be afebrile, saturating well on room air, and with stable blood pressure.  EKG features sinus rhythm and chest x-ray negative for acute cardiopulmonary disease.  CT abdomen pelvis notable for thickened urinary bladder.  Chemistry panel features a creatinine 1.54 and glucose 299.  CBC notable for leukocytosis 12,400 and slight normocytic anemia.  Troponin is normal x2.  Patient was given 2 L of saline, IV Rocephin, Zofran, and Reglan in the ED.  Review of Systems:  History limited by patient's clinical condition.  Past Medical History:  Diagnosis Date  . DM (diabetes mellitus) (HCC)   . HLD (hyperlipidemia)   . HTN (hypertension)   . Hypothyroid   . Stroke [facility])     History reviewed. No pertinent surgical history.  Social History:   reports that he has never smoked. He has never used smokeless tobacco. He reports previous alcohol use. He reports previous drug use.  No Known Allergies  Family History  Problem Relation  Age of Onset  . Diabetes Mother      Prior to Admission medications   Medication Sig Start Date End Date Taking? Authorizing Provider  atorvastatin (LIPITOR) 40 MG tablet Take 1 tablet (40 mg total) by mouth daily. 02/22/21  Yes Carlean Jews, NP  empagliflozin (JARDIANCE) 10 MG TABS tablet Take 1 tablet (10 mg total) by mouth daily before breakfast. 02/22/21  Yes Boscia, Kathlynn Grate, NP  levothyroxine (SYNTHROID) 50 MCG tablet Take 1 tablet (50 mcg total) by mouth daily before breakfast. 02/22/21  Yes Boscia, Heather E, NP  losartan (COZAAR) 50 MG tablet Take 1 tablet (50 mg total) by mouth daily. 02/22/21  Yes Carlean Jews, NP  metFORMIN (GLUCOPHAGE) 1000 MG tablet Take 1 tablet (1,000 mg total) by mouth 2 (two) times daily with a meal. 02/22/21  Yes Boscia, Heather E, NP  metoprolol tartrate (LOPRESSOR) 25 MG tablet Take 1 tablet (25 mg total) by mouth daily. 02/22/21  Yes Boscia, Kathlynn Grate, NP  QUEtiapine (SEROQUEL) 25 MG tablet Take 1/2 to 1 tablet at bedtime as needed Patient taking differently: Take 25 mg by mouth at bedtime. 03/17/21  Yes Carlean Jews, NP    Physical Exam: Vitals:   04/10/21 1952 04/10/21 2100 04/10/21 2159 04/10/21 2214  BP: 137/70 (!) 156/81  (!) 143/84  Pulse: 86 93 98 96  Resp: 14 17  16   Temp:    97.8 F (36.6 C)  TempSrc:      SpO2: 100% 99% 100% 99%  Weight:   45.5 kg  Height:   5' (1.524 m)     Constitutional: NAD, calm  Eyes: PERTLA, lids and conjunctivae normal ENMT: Mucous membranes are moist. Posterior pharynx clear of any exudate or lesions.   Neck:  supple, no masses  Respiratory: no wheezing, no crackles. No accessory muscle use.  Cardiovascular: S1 & S2 heard, regular rate and rhythm. No extremity edema.   Abdomen: No distension, no tenderness, soft. Bowel sounds active.  Musculoskeletal: no clubbing / cyanosis. Flexion contractures on right.   Skin: no significant rashes, lesions, ulcers. Warm, dry, well-perfused. Neurologic: no  gross facial asymmetry. Sensation intact. Moving all extremities. Contracted on right.  Psychiatric: Awake, oriented to self only. Calm and cooperative.    Labs and Imaging on Admission: I have personally reviewed following labs and imaging studies  CBC: Recent Labs  Lab 04/10/21 1318  WBC 11.4*  HGB 12.8*  HCT 37.4*  MCV 85.6  PLT 302   Basic Metabolic Panel: Recent Labs  Lab 04/10/21 1318  NA 137  K 4.9  CL 103  CO2 20*  GLUCOSE 299*  BUN 31*  CREATININE 1.54*  CALCIUM 8.7*   GFR: Estimated Creatinine Clearance: 30.4 mL/min (A) (by C-G formula based on SCr of 1.54 mg/dL (H)). Liver Function Tests: Recent Labs  Lab 04/10/21 1554  AST 19  ALT 22  ALKPHOS 78  BILITOT 0.9  PROT 7.0  ALBUMIN 3.3*   Recent Labs  Lab 04/10/21 1318  LIPASE 23   No results for input(s): AMMONIA in the last 168 hours. Coagulation Profile: No results for input(s): INR, PROTIME in the last 168 hours. Cardiac Enzymes: No results for input(s): CKTOTAL, CKMB, CKMBINDEX, TROPONINI in the last 168 hours. BNP (last 3 results) No results for input(s): PROBNP in the last 8760 hours. HbA1C: No results for input(s): HGBA1C in the last 72 hours. CBG: Recent Labs  Lab 04/10/21 1312  GLUCAP 279*   Lipid Profile: No results for input(s): CHOL, HDL, LDLCALC, TRIG, CHOLHDL, LDLDIRECT in the last 72 hours. Thyroid Function Tests: No results for input(s): TSH, T4TOTAL, FREET4, T3FREE, THYROIDAB in the last 72 hours. Anemia Panel: No results for input(s): VITAMINB12, FOLATE, FERRITIN, TIBC, IRON, RETICCTPCT in the last 72 hours. Urine analysis:    Component Value Date/Time   COLORURINE YELLOW 04/10/2021 1345   APPEARANCEUR CLEAR 04/10/2021 1345   LABSPEC 1.042 (H) 04/10/2021 1345   PHURINE 5.0 04/10/2021 1345   GLUCOSEU >=500 (A) 04/10/2021 1345   HGBUR LARGE (A) 04/10/2021 1345   BILIRUBINUR NEGATIVE 04/10/2021 1345   KETONESUR 20 (A) 04/10/2021 1345   PROTEINUR 100 (A) 04/10/2021  1345   NITRITE POSITIVE (A) 04/10/2021 1345   LEUKOCYTESUR MODERATE (A) 04/10/2021 1345   Sepsis Labs: @LABRCNTIP (procalcitonin:4,lacticidven:4) ) Recent Results (from the past 240 hour(s))  Resp Panel by RT-PCR (Flu A&B, Covid) Nasopharyngeal Swab     Status: None   Collection Time: 04/10/21  8:23 PM   Specimen: Nasopharyngeal Swab; Nasopharyngeal(NP) swabs in vial transport medium  Result Value Ref Range Status   SARS Coronavirus 2 by RT PCR NEGATIVE NEGATIVE Final    Comment: (NOTE) SARS-CoV-2 target nucleic acids are NOT DETECTED.  The SARS-CoV-2 RNA is generally detectable in upper respiratory specimens during the acute phase of infection. The lowest concentration of SARS-CoV-2 viral copies this assay can detect is 138 copies/mL. A negative result does not preclude SARS-Cov-2 infection and should not be used as the sole basis for treatment or other patient management decisions. A negative result may occur with  improper specimen collection/handling, submission of specimen other than nasopharyngeal swab, presence of viral mutation(s) within the areas targeted by this assay, and inadequate number of viral copies(<138 copies/mL). A negative result must be combined with clinical observations, patient history, and epidemiological information. The expected result is Negative.  Fact Sheet for Patients:  BloggerCourse.comhttps://www.fda.gov/media/152166/download  Fact Sheet for Healthcare Providers:  SeriousBroker.ithttps://www.fda.gov/media/152162/download  This test is no t yet approved or cleared by the [place] FDA and  has been authorized for detection and/or diagnosis of SARS-CoV-2 by FDA under an Emergency Use Authorization (EUA). This EUA will remain  in effect (meaning this test can be used) for the duration of the COVID-19 declaration under Section 564(b)(1) of the Act, 21 U.S.C.section 360bbb-3(b)(1), unless the authorization is terminated  or revoked sooner.       Influenza A by PCR  NEGATIVE NEGATIVE Final   Influenza B by PCR NEGATIVE NEGATIVE Final    Comment: (NOTE) The Xpert Xpress SARS-CoV-2/FLU/RSV plus assay is intended as an aid in the diagnosis of influenza from Nasopharyngeal swab specimens and should not be used as a sole basis for treatment. Nasal washings and aspirates are unacceptable for Xpert Xpress SARS-CoV-2/FLU/RSV testing.  Fact Sheet for Patients: BloggerCourse.comhttps://www.fda.gov/media/152166/download  Fact Sheet for Healthcare Providers: SeriousBroker.ithttps://www.fda.gov/media/152162/download  This test is not yet approved or cleared by the [place] FDA and has been authorized for detection and/or diagnosis of SARS-CoV-2 by FDA under an Emergency Use Authorization (EUA). This EUA will remain in effect (meaning this test can be used) for the duration of the COVID-19 declaration under Section 564(b)(1) of the Act, 21 U.S.C. section 360bbb-3(b)(1), unless the authorization is terminated or revoked.  Performed at [facility], [address] [place], [place] [id number]      Radiological Exams on Admission: CT Abdomen Pelvis W Contrast  Result Date: 04/10/2021 CLINICAL DATA:  Weakness and vomiting. EXAM: CT ABDOMEN AND PELVIS WITH CONTRAST TECHNIQUE: Multidetector CT imaging of the abdomen and pelvis was performed using the standard protocol following bolus administration of intravenous contrast. CONTRAST:  75mL OMNIPAQUE IOHEXOL 300 MG/ML  SOLN COMPARISON:  75 mL omni 300 FINDINGS: Lower chest: No acute abnormality. Hepatobiliary: No focal liver abnormality is seen. No gallstones, gallbladder wall thickening, or biliary dilatation. Pancreas: Unremarkable. No pancreatic ductal dilatation or surrounding inflammatory changes. Spleen: Normal in size without focal abnormality. Adrenals/Urinary Tract: Adrenal glands are unremarkable. Kidneys are symmetric in size. No renal calculi or hydronephrosis. No mass lesion. Urinary bladder appears thick-walled.  Stomach/Bowel: Stomach is within normal limits. Appendix appears normal. No evidence of bowel wall thickening, distention, or inflammatory changes. Vascular/Lymphatic: Aortic atherosclerosis. No enlarged abdominal or pelvic lymph nodes. Reproductive: Prostate is unremarkable. Other: No abdominal wall hernia or abnormality. No abdominopelvic ascites. Musculoskeletal: No acute or significant osseous findings. IMPRESSION: 1. Urinary bladder appears thick-walled. Recommend correlation with urinalysis to exclude cystitis. 2. Aortic atherosclerosis. Aortic Atherosclerosis (ICD10-I70.0). Electronically Signed   By: Emmaline KluverNancy  Ballantyne M.D.   On: 04/10/2021 17:45   DG Chest Port 1 View  Result Date: 04/10/2021 CLINICAL DATA:  Weakness since receiving COVID vaccine 8 days ago. EXAM: PORTABLE CHEST 1 VIEW COMPARISON:  None. FINDINGS: The heart size and mediastinal contours are within normal limits. Both lungs are clear. The visualized skeletal structures are unremarkable. IMPRESSION: No active disease. Electronically Signed   By: Sherian ReinWei-Chen  Lin M.D.   On: 04/10/2021 14:05    EKG: Independently reviewed. Sinus rhythm.   Assessment/Plan   1. Acute cystitis  - Presents with  several days of progressive fatigue, loss of appetite, and N/V and is found to have leukocytosis, thickened urinary bladder on CT, UA compatible with infection  - Urine sent for cultures and Rocephin started in ED  - Continue Rocephin, follow culture and clinical course    2. Nausea, vomiting  - Abdominal exam benign, no concerning findings on CT abd/pelvis in ED  - Likely secondary to UTI, continue antibiotics, supportive care, advance diet as tolerated   3. Acute kidney injury  - SCr is 1.54 on admission  - No hx of CKD per family, this is likely AKI in setting of poor appetite and N/V  - No hydronephrosis on CT in ED  - Hold losartan, check FENa, continue IVF hydration, renally-dose medications, repeat chem panel in am    4. Dementia   - Calm on admission, continue Seroquel qHS, delirium precautions    5. History of CVA  - Has residual right-sided deficits, no acute deficits noted by family    6. Hypothyroidism  - Continue Synthroid    7. Hypertension  - Hold losartan as above, continue metoprolol as tolerated   8. Type II DM - A1c was 11.8% in February 2022  - Check CBGs and use SSI for now    DVT prophylaxis: Lovenox  Code Status: Full  Level of Care: Level of care: Med-Surg Family Communication: Discussed with daughter by phone  Disposition Plan:  Patient is from: Home  Anticipated d/c is to: TBD Anticipated d/c date is: 04/12/21 Patient currently: Pending improvement in renal function and lethargy with IVF and antibiotics  Consults called: None  Admission status: Inpatient     Briscoe Deutscher, MD [facility]  04/10/2021, 10:18 PM

## 2021-04-10 NOTE — ED Notes (Signed)
Attempted in and out, no return, will try again later. Condom cath placed.

## 2021-04-11 ENCOUNTER — Other Ambulatory Visit: Payer: Self-pay

## 2021-04-11 DIAGNOSIS — E039 Hypothyroidism, unspecified: Secondary | ICD-10-CM | POA: Diagnosis not present

## 2021-04-11 DIAGNOSIS — F0281 Dementia in other diseases classified elsewhere with behavioral disturbance: Secondary | ICD-10-CM | POA: Diagnosis not present

## 2021-04-11 DIAGNOSIS — E119 Type 2 diabetes mellitus without complications: Secondary | ICD-10-CM

## 2021-04-11 DIAGNOSIS — N3001 Acute cystitis with hematuria: Principal | ICD-10-CM

## 2021-04-11 DIAGNOSIS — R112 Nausea with vomiting, unspecified: Secondary | ICD-10-CM

## 2021-04-11 DIAGNOSIS — N179 Acute kidney failure, unspecified: Secondary | ICD-10-CM | POA: Diagnosis not present

## 2021-04-11 DIAGNOSIS — Z8673 Personal history of transient ischemic attack (TIA), and cerebral infarction without residual deficits: Secondary | ICD-10-CM

## 2021-04-11 LAB — CBC
HCT: 30.8 % — ABNORMAL LOW (ref 39.0–52.0)
Hemoglobin: 10.8 g/dL — ABNORMAL LOW (ref 13.0–17.0)
MCH: 29.5 pg (ref 26.0–34.0)
MCHC: 35.1 g/dL (ref 30.0–36.0)
MCV: 84.2 fL (ref 80.0–100.0)
Platelets: 279 10*3/uL (ref 150–400)
RBC: 3.66 MIL/uL — ABNORMAL LOW (ref 4.22–5.81)
RDW: 13.6 % (ref 11.5–15.5)
WBC: 10.1 10*3/uL (ref 4.0–10.5)
nRBC: 0 % (ref 0.0–0.2)

## 2021-04-11 LAB — GLUCOSE, CAPILLARY
Glucose-Capillary: 113 mg/dL — ABNORMAL HIGH (ref 70–99)
Glucose-Capillary: 136 mg/dL — ABNORMAL HIGH (ref 70–99)
Glucose-Capillary: 158 mg/dL — ABNORMAL HIGH (ref 70–99)
Glucose-Capillary: 198 mg/dL — ABNORMAL HIGH (ref 70–99)

## 2021-04-11 LAB — BASIC METABOLIC PANEL
Anion gap: 9 (ref 5–15)
BUN: 19 mg/dL (ref 8–23)
CO2: 25 mmol/L (ref 22–32)
Calcium: 7.8 mg/dL — ABNORMAL LOW (ref 8.9–10.3)
Chloride: 108 mmol/L (ref 98–111)
Creatinine, Ser: 1.22 mg/dL (ref 0.61–1.24)
GFR, Estimated: >60 mL/min (ref >60)
Glucose, Bld: 122 mg/dL — ABNORMAL HIGH (ref 70–99)
Potassium: 3.7 mmol/L (ref 3.5–5.1)
Sodium: 142 mmol/L (ref 135–145)

## 2021-04-11 LAB — TSH: TSH: 11.528 u[IU]/mL — ABNORMAL HIGH (ref 0.350–4.500)

## 2021-04-11 LAB — CREATININE, URINE, RANDOM: Creatinine, Urine: 55.66 mg/dL

## 2021-04-11 LAB — SODIUM, URINE, RANDOM: Sodium, Ur: 103 mmol/L

## 2021-04-11 LAB — HIV ANTIBODY (ROUTINE TESTING W REFLEX): HIV Screen 4th Generation wRfx: NONREACTIVE

## 2021-04-11 NOTE — Evaluation (Signed)
Physical Therapy Evaluation Patient Details Name: Victor Copeland MRN: [id number] DOB: Dec 12, 1956 Today's Date: 04/11/2021   History of Present Illness  Pt admitted from home 2* weakness, N/V and dx with acute cystitis and UTI with hematuria.  Pt with hx of DM, dementia and CVA with residual R side deficits  Clinical Impression  Pt admitted as above and presenting with functional mobility limitations 2* generalized weakness with residual R side deficits from prior CVA; balance deficits and pre-existing dementia related cognitive deficits.  Provided pt's family can continue to provide needed level of assist, pt should progress to dc home with family assist and could benefit from follow up HHPT to maximize IND and safety at home.    Follow Up Recommendations Home health PT    Equipment Recommendations  None recommended by PT    Recommendations for Other Services       Precautions / Restrictions Precautions Precautions: Fall Restrictions Weight Bearing Restrictions: No      Mobility  Bed Mobility Overal bed mobility: Needs Assistance Bed Mobility: Supine to Sit     Supine to sit: Min assist     General bed mobility comments: Pt utilizing bed rail but still requires min assist to bring trunk to upright and to balance in initial sitting    Transfers Overall transfer level: Needs assistance Equipment used: Rolling walker (2 wheeled) Transfers: Sit to/from Stand Sit to Stand: Min assist         General transfer comment: cues for use of UEs and assist to bring wt up and fwd and to correct for retropulsion with initial standing  Ambulation/Gait Ambulation/Gait assistance: Min assist;Mod assist Gait Distance (Feet): 28 Feet Assistive device: Rolling walker (2 wheeled) Gait Pattern/deviations: Step-to pattern;Step-through pattern;Decreased step length - right;Decreased step length - left;Shuffle;Trunk flexed Gait velocity: decr   General Gait Details: cues for posture and  position from RW.  Physical assist for balance/support with RW.  Distance ltd by fatigue with noted increased shuffling with R LE.  Stairs            Wheelchair Mobility    Modified Rankin (Stroke Patients Only)       Balance Overall balance assessment: Needs assistance Sitting-balance support: No upper extremity supported;Feet supported Sitting balance-Leahy Scale: Fair     Standing balance support: Bilateral upper extremity supported Standing balance-Leahy Scale: Poor Standing balance comment: posterior drift                             Pertinent Vitals/Pain Pain Assessment: No/denies pain    Home Living Family/patient expects to be discharged to:: Private residence Living Arrangements: Children               Additional Comments: Pt unable to provide details but does state he lives with dtr and uses a RW    Prior Function Level of Independence: Needs assistance   Gait / Transfers Assistance Needed: RW and assist  ADL's / Homemaking Assistance Needed: Lives with dtr        Hand Dominance        Extremity/Trunk Assessment   Upper Extremity Assessment Upper Extremity Assessment: Generalized weakness;RUE deficits/detail;Difficult to assess due to impaired cognition RUE Deficits / Details: Residual deficits from CVA    Lower Extremity Assessment Lower Extremity Assessment: Difficult to assess due to impaired cognition;Generalized weakness;RLE deficits/detail RLE Deficits / Details: residual deficits from prior CVA  - full detailed assessment limited by dementia and limited [place]  Communication   Communication: Prefers language other than English  Cognition Arousal/Alertness: Awake/alert Behavior During Therapy: WFL for tasks assessed/performed;Flat affect Overall Cognitive Status: History of cognitive impairments - at baseline                                 General Comments: Follows simple commands and answers  simple questions appropriately      General Comments      Exercises     Assessment/Plan    PT Assessment Patient needs continued PT services  PT Problem List Decreased strength;Decreased activity tolerance;Decreased balance;Decreased mobility;Decreased cognition;Decreased knowledge of use of DME;Decreased safety awareness       PT Treatment Interventions DME instruction;Gait training;Stair training;Functional mobility training;Therapeutic activities;Therapeutic exercise;Balance training;Patient/family education    PT Goals (Current goals can be found in the Care Plan section)  Acute Rehab PT Goals Patient Stated Goal: Get OOB PT Goal Formulation: Patient unable to participate in goal setting Time For Goal Achievement: 04/25/21 Potential to Achieve Goals: Fair    Frequency Min 3X/week   Barriers to discharge        Co-evaluation               AM-PAC PT "6 Clicks" Mobility  Outcome Measure Help needed turning from your back to your side while in a flat bed without using bedrails?: A Little Help needed moving from lying on your back to sitting on the side of a flat bed without using bedrails?: A Little Help needed moving to and from a bed to a chair (including a wheelchair)?: A Little Help needed standing up from a chair using your arms (e.g., wheelchair or bedside chair)?: A Little Help needed to walk in hospital room?: A Little Help needed climbing 3-5 steps with a railing? : A Lot 6 Click Score: 17    End of Session Equipment Utilized During Treatment: Gait belt Activity Tolerance: Patient limited by fatigue Patient left: in chair;with call bell/phone within reach;with chair alarm set Nurse Communication: Mobility status PT Visit Diagnosis: Muscle weakness (generalized) (M62.81);Difficulty in walking, not elsewhere classified (R26.2)    Time: 0623-7628 PT Time Calculation (min) (ACUTE ONLY): 15 min   Charges:   PT Evaluation $PT Eval Low Complexity: 1  Low          Mauro Kaufmann PT Acute Rehabilitation Services Pager [phone] Office [phone]   Victor Copeland 04/11/2021, 1:50 PM

## 2021-04-11 NOTE — Progress Notes (Signed)
The ipad "interpreter" was used to communicate with the patient but the patient also knows some English too.

## 2021-04-11 NOTE — Progress Notes (Signed)
PROGRESS NOTE    Victor Copeland  JSE:[id number] DOB: Sep 05, 1956 DOA: 04/10/2021 PCP: Carlean Jews, NP   Brief Narrative:  Victor Copeland is a 65 y.o. male with medical history significant for hypertension, type 2 diabetes mellitus, dementia, hypothyroidism, and history of CVA with right-sided motor deficits, presenting to the emergency department for evaluation of decreased appetite, lethargy, and nausea with nonbloody vomiting.  History is obtained from the patient's daughter who notes that the patient had been in a memory care unit in [place] but has been at home since they recently moved to this area.  He had some increased fatigue and poor appetite 8 days ago after receiving COVID vaccination, initially seem to be improving, but then marked progression in fatigue, loss of appetite, also nausea with nonbloody vomiting over the past few days.  ED Course: Upon arrival to the ED, patient is found to be afebrile, saturating well on room air, and with stable blood pressure.  EKG features sinus rhythm and chest x-ray negative for acute cardiopulmonary disease.  CT abdomen pelvis notable for thickened urinary bladder.  Chemistry panel features a creatinine 1.54 and glucose 299.  CBC notable for leukocytosis 12,400 and slight normocytic anemia.  Troponin is normal x2.  Patient was given 2 L of saline, IV Rocephin, Zofran, and Reglan in the ED.   Assessment & Plan:   Principal Problem:   Urinary tract infection with hematuria Active Problems:   Type 2 diabetes mellitus without complication (HCC)   Acquired hypothyroidism   History of ischemic stroke   Dementia associated with other underlying disease with behavioral disturbance (HCC)   AKI (acute kidney injury) (HCC)   Nausea & vomiting   1. Acute cystitis  - Presented  with several days of progressive fatigue, loss of appetite, and N/V and is found to have leukocytosis, thickened urinary bladder on CT, UA compatible with infection  - Urine sent for  cultures and remains pending  - Rocephin started in ED  - Continue Rocephin, follow culture and clinical course    2. Nausea, vomiting  - Abdominal exam benign, no concerning findings on CT abd/pelvis in ED  - Likely secondary to UTI, continue antibiotics, supportive care,  -On heart healthy diet  3. Acute kidney injury  - SCr is 1.54 on admission  to 1.22 with hydration - No hx of CKD per family, this is likely AKI in setting of poor appetite and N/V  - No hydronephrosis on CT in ED  - Holding losartan additional day, continue IVF hydration, renally-dose medications, repeat chem panel in am    4. Dementia  - Calm on admission, continue Seroquel qHS, delirium precautions    5. History of CVA  - Has residual right-sided deficits, no acute deficits noted by family    6. Hypothyroidism  - Continue Synthroid    7. Hypertension  - Hold losartan as above, continue metoprolol as tolerated   8. Type II DM - A1c was 11.8% in February 2022  - Check CBGs and use SSI for now   Disposition: Discussed plan of care with daughter who indicated family would like to pursue placement at the memory care unit secondary to advanced dementia.  They indicated patient was in a memory care unit in [place] prior to moving here recently  DVT prophylaxis: Lovenox SQ  Code Status: full    Code Status Orders  (From admission, onward)         Start     Ordered   04/10/21  2217  Full code  Continuous        04/10/21 2217        Code Status History    This patient has a current code status but no historical code status.   Advance Care Planning Activity     Family Communication: Discussed plan of care with daughter.  She indicated they like to pursue placement as patient recently moved from [place] was in a memory care unit because of dementia. Disposition Plan:   Patient remained inpatient we will continue to treat with IV antibiotics for acute encephalopathy secondary to infection  patient is not yet stable for discharge Consults called: None Admission status: Inpatient   Consultants:   None  Procedures:  CT Abdomen Pelvis W Contrast  Result Date: 04/10/2021 CLINICAL DATA:  Weakness and vomiting. EXAM: CT ABDOMEN AND PELVIS WITH CONTRAST TECHNIQUE: Multidetector CT imaging of the abdomen and pelvis was performed using the standard protocol following bolus administration of intravenous contrast. CONTRAST:  59mL OMNIPAQUE IOHEXOL 300 MG/ML  SOLN COMPARISON:  75 mL omni 300 FINDINGS: Lower chest: No acute abnormality. Hepatobiliary: No focal liver abnormality is seen. No gallstones, gallbladder wall thickening, or biliary dilatation. Pancreas: Unremarkable. No pancreatic ductal dilatation or surrounding inflammatory changes. Spleen: Normal in size without focal abnormality. Adrenals/Urinary Tract: Adrenal glands are unremarkable. Kidneys are symmetric in size. No renal calculi or hydronephrosis. No mass lesion. Urinary bladder appears thick-walled. Stomach/Bowel: Stomach is within normal limits. Appendix appears normal. No evidence of bowel wall thickening, distention, or inflammatory changes. Vascular/Lymphatic: Aortic atherosclerosis. No enlarged abdominal or pelvic lymph nodes. Reproductive: Prostate is unremarkable. Other: No abdominal wall hernia or abnormality. No abdominopelvic ascites. Musculoskeletal: No acute or significant osseous findings. IMPRESSION: 1. Urinary bladder appears thick-walled. Recommend correlation with urinalysis to exclude cystitis. 2. Aortic atherosclerosis. Aortic Atherosclerosis (ICD10-I70.0). Electronically Signed   By: Emmaline Kluver M.D.   On: 04/10/2021 17:45   DG Chest Port 1 View  Result Date: 04/10/2021 CLINICAL DATA:  Weakness since receiving COVID vaccine 8 days ago. EXAM: PORTABLE CHEST 1 VIEW COMPARISON:  None. FINDINGS: The heart size and mediastinal contours are within normal limits. Both lungs are clear. The visualized skeletal  structures are unremarkable. IMPRESSION: No active disease. Electronically Signed   By: Sherian Rein M.D.   On: 04/10/2021 14:05     Antimicrobials:   Ceftriaxone day #2   Subjective: Patient with advanced dementia with memory impairment No acute events overnight was resting in bed comfortably when I saw him  Objective: Vitals:   04/10/21 2214 04/11/21 0206 04/11/21 0559 04/11/21 0926  BP: (!) 143/84 138/75 128/65 128/66  Pulse: 96 88 70 64  Resp: 16 17 14 13   Temp: 97.8 F (36.6 C) 98 F (36.7 C) 98 F (36.7 C) 98 F (36.7 C)  TempSrc: Oral Oral Oral Axillary  SpO2: 99% 100% 99% 98%  Weight:      Height:        Intake/Output Summary (Last 24 hours) at 04/11/2021 1134 Last data filed at 04/11/2021 0200 Gross per 24 hour  Intake 1278.57 ml  Output --  Net 1278.57 ml   Filed Weights   04/10/21 2159  Weight: 45.5 kg    Examination:  General exam: Appears calm and comfortable known chronic advanced cognitive deficits with dementia Respiratory system: Clear to auscultation. Respiratory effort normal. Cardiovascular system: S1 & S2 heard, RRR. No JVD, murmurs, rubs, gallops or clicks. No pedal edema. Gastrointestinal system: Abdomen is nondistended, soft and nontender. No  organomegaly or masses felt. Normal bowel sounds heard. Central nervous system: Alert and oriented. No focal neurological deficits. Extremities: Warm well perfused, no edema Skin: No rashes, lesions or ulcers Psychiatry: Judgement and insight severely impaired. Mood & affect flat, known advanced dementia with cognitive deficits    Data Reviewed: I have personally reviewed following labs and imaging studies  CBC: Recent Labs  Lab 04/10/21 1318 04/11/21 0516  WBC 11.4* 10.1  HGB 12.8* 10.8*  HCT 37.4* 30.8*  MCV 85.6 84.2  PLT 302 279   Basic Metabolic Panel: Recent Labs  Lab 04/10/21 1318 04/11/21 0516  NA 137 142  K 4.9 3.7  CL 103 108  CO2 20* 25  GLUCOSE 299* 122*  BUN 31* 19   CREATININE 1.54* 1.22  CALCIUM 8.7* 7.8*   GFR: Estimated Creatinine Clearance: 38.3 mL/min (by C-G formula based on SCr of 1.22 mg/dL). Liver Function Tests: Recent Labs  Lab 04/10/21 1554  AST 19  ALT 22  ALKPHOS 78  BILITOT 0.9  PROT 7.0  ALBUMIN 3.3*   Recent Labs  Lab 04/10/21 1318  LIPASE 23   No results for input(s): AMMONIA in the last 168 hours. Coagulation Profile: No results for input(s): INR, PROTIME in the last 168 hours. Cardiac Enzymes: No results for input(s): CKTOTAL, CKMB, CKMBINDEX, TROPONINI in the last 168 hours. BNP (last 3 results) No results for input(s): PROBNP in the last 8760 hours. HbA1C: No results for input(s): HGBA1C in the last 72 hours. CBG: Recent Labs  Lab 04/10/21 1312 04/10/21 2259 04/11/21 0751  GLUCAP 279* 214* 113*   Lipid Profile: No results for input(s): CHOL, HDL, LDLCALC, TRIG, CHOLHDL, LDLDIRECT in the last 72 hours. Thyroid Function Tests: Recent Labs    04/11/21 0516  TSH 11.528*   Anemia Panel: No results for input(s): VITAMINB12, FOLATE, FERRITIN, TIBC, IRON, RETICCTPCT in the last 72 hours. Sepsis Labs: No results for input(s): PROCALCITON, LATICACIDVEN in the last 168 hours.  Recent Results (from the past 240 hour(s))  Resp Panel by RT-PCR (Flu A&B, Covid) Nasopharyngeal Swab     Status: None   Collection Time: 04/10/21  8:23 PM   Specimen: Nasopharyngeal Swab; Nasopharyngeal(NP) swabs in vial transport medium  Result Value Ref Range Status   SARS Coronavirus 2 by RT PCR NEGATIVE NEGATIVE Final    Comment: (NOTE) SARS-CoV-2 target nucleic acids are NOT DETECTED.  The SARS-CoV-2 RNA is generally detectable in upper respiratory specimens during the acute phase of infection. The lowest concentration of SARS-CoV-2 viral copies this assay can detect is 138 copies/mL. A negative result does not preclude SARS-Cov-2 infection and should not be used as the sole basis for treatment or other patient management  decisions. A negative result may occur with  improper specimen collection/handling, submission of specimen other than nasopharyngeal swab, presence of viral mutation(s) within the areas targeted by this assay, and inadequate number of viral copies(<138 copies/mL). A negative result must be combined with clinical observations, patient history, and epidemiological information. The expected result is Negative.  Fact Sheet for Patients:  BloggerCourse.comhttps://www.fda.gov/media/152166/download  Fact Sheet for Healthcare Providers:  SeriousBroker.ithttps://www.fda.gov/media/152162/download  This test is no t yet approved or cleared by the [place] FDA and  has been authorized for detection and/or diagnosis of SARS-CoV-2 by FDA under an Emergency Use Authorization (EUA). This EUA will remain  in effect (meaning this test can be used) for the duration of the COVID-19 declaration under Section 564(b)(1) of the Act, 21 U.S.C.section 360bbb-3(b)(1), unless the authorization is  terminated  or revoked sooner.       Influenza A by PCR NEGATIVE NEGATIVE Final   Influenza B by PCR NEGATIVE NEGATIVE Final    Comment: (NOTE) The Xpert Xpress SARS-CoV-2/FLU/RSV plus assay is intended as an aid in the diagnosis of influenza from Nasopharyngeal swab specimens and should not be used as a sole basis for treatment. Nasal washings and aspirates are unacceptable for Xpert Xpress SARS-CoV-2/FLU/RSV testing.  Fact Sheet for Patients: BloggerCourse.com  Fact Sheet for Healthcare Providers: SeriousBroker.it  This test is not yet approved or cleared by the [place] FDA and has been authorized for detection and/or diagnosis of SARS-CoV-2 by FDA under an Emergency Use Authorization (EUA). This EUA will remain in effect (meaning this test can be used) for the duration of the COVID-19 declaration under Section 564(b)(1) of the Act, 21 U.S.C. section 360bbb-3(b)(1), unless the  authorization is terminated or revoked.  Performed at [facility], [address], [place], [place] [address]          Radiology Studies: CT Abdomen Pelvis W Contrast  Result Date: 04/10/2021 CLINICAL DATA:  Weakness and vomiting. EXAM: CT ABDOMEN AND PELVIS WITH CONTRAST TECHNIQUE: Multidetector CT imaging of the abdomen and pelvis was performed using the standard protocol following bolus administration of intravenous contrast. CONTRAST:  47mL OMNIPAQUE IOHEXOL 300 MG/ML  SOLN COMPARISON:  75 mL omni 300 FINDINGS: Lower chest: No acute abnormality. Hepatobiliary: No focal liver abnormality is seen. No gallstones, gallbladder wall thickening, or biliary dilatation. Pancreas: Unremarkable. No pancreatic ductal dilatation or surrounding inflammatory changes. Spleen: Normal in size without focal abnormality. Adrenals/Urinary Tract: Adrenal glands are unremarkable. Kidneys are symmetric in size. No renal calculi or hydronephrosis. No mass lesion. Urinary bladder appears thick-walled. Stomach/Bowel: Stomach is within normal limits. Appendix appears normal. No evidence of bowel wall thickening, distention, or inflammatory changes. Vascular/Lymphatic: Aortic atherosclerosis. No enlarged abdominal or pelvic lymph nodes. Reproductive: Prostate is unremarkable. Other: No abdominal wall hernia or abnormality. No abdominopelvic ascites. Musculoskeletal: No acute or significant osseous findings. IMPRESSION: 1. Urinary bladder appears thick-walled. Recommend correlation with urinalysis to exclude cystitis. 2. Aortic atherosclerosis. Aortic Atherosclerosis (ICD10-I70.0). Electronically Signed   By: Emmaline Kluver M.D.   On: 04/10/2021 17:45   DG Chest Port 1 View  Result Date: 04/10/2021 CLINICAL DATA:  Weakness since receiving COVID vaccine 8 days ago. EXAM: PORTABLE CHEST 1 VIEW COMPARISON:  None. FINDINGS: The heart size and mediastinal contours are within normal limits. Both lungs are  clear. The visualized skeletal structures are unremarkable. IMPRESSION: No active disease. Electronically Signed   By: Sherian Rein M.D.   On: 04/10/2021 14:05        Scheduled Meds: . atorvastatin  40 mg Oral Daily  . enoxaparin (LOVENOX) injection  40 mg Subcutaneous Q24H  . insulin aspart  0-5 Units Subcutaneous QHS  . insulin aspart  0-9 Units Subcutaneous TID WC  . levothyroxine  50 mcg Oral QAC breakfast  . metoprolol tartrate  25 mg Oral Daily  . QUEtiapine  25 mg Oral QHS   Continuous Infusions: . cefTRIAXone (ROCEPHIN)  IV       LOS: 1 day    Time spent: 35 min    Burke Keels, MD [facility]  If 7PM-7AM, please contact night-coverage  04/11/2021, 11:34 AM

## 2021-04-11 NOTE — Plan of Care (Signed)
  Problem: Clinical Measurements: Goal: Cardiovascular complication will be avoided Outcome: Progressing   

## 2021-04-12 DIAGNOSIS — N179 Acute kidney failure, unspecified: Secondary | ICD-10-CM | POA: Diagnosis not present

## 2021-04-12 DIAGNOSIS — N3001 Acute cystitis with hematuria: Secondary | ICD-10-CM | POA: Diagnosis not present

## 2021-04-12 DIAGNOSIS — E039 Hypothyroidism, unspecified: Secondary | ICD-10-CM | POA: Diagnosis not present

## 2021-04-12 DIAGNOSIS — F0281 Dementia in other diseases classified elsewhere with behavioral disturbance: Secondary | ICD-10-CM | POA: Diagnosis not present

## 2021-04-12 LAB — BASIC METABOLIC PANEL
Anion gap: 8 (ref 5–15)
BUN: 18 mg/dL (ref 8–23)
CO2: 25 mmol/L (ref 22–32)
Calcium: 8 mg/dL — ABNORMAL LOW (ref 8.9–10.3)
Chloride: 107 mmol/L (ref 98–111)
Creatinine, Ser: 1.26 mg/dL — ABNORMAL HIGH (ref 0.61–1.24)
GFR, Estimated: >60 mL/min (ref >60)
Glucose, Bld: 186 mg/dL — ABNORMAL HIGH (ref 70–99)
Potassium: 3.4 mmol/L — ABNORMAL LOW (ref 3.5–5.1)
Sodium: 140 mmol/L (ref 135–145)

## 2021-04-12 LAB — CBC
HCT: 31.1 % — ABNORMAL LOW (ref 39.0–52.0)
Hemoglobin: 10.9 g/dL — ABNORMAL LOW (ref 13.0–17.0)
MCH: 29.6 pg (ref 26.0–34.0)
MCHC: 35 g/dL (ref 30.0–36.0)
MCV: 84.5 fL (ref 80.0–100.0)
Platelets: 254 10*3/uL (ref 150–400)
RBC: 3.68 MIL/uL — ABNORMAL LOW (ref 4.22–5.81)
RDW: 13.6 % (ref 11.5–15.5)
WBC: 9.4 10*3/uL (ref 4.0–10.5)
nRBC: 0 % (ref 0.0–0.2)

## 2021-04-12 LAB — GLUCOSE, CAPILLARY
Glucose-Capillary: 138 mg/dL — ABNORMAL HIGH (ref 70–99)
Glucose-Capillary: 355 mg/dL — ABNORMAL HIGH (ref 70–99)

## 2021-04-12 MED ORDER — CEPHALEXIN 500 MG PO CAPS: 500.0000 mg | ORAL_CAPSULE | Freq: Four times a day (QID) | ORAL | 0 refills | Status: AC

## 2021-04-12 NOTE — Discharge Summary (Signed)
Physician Discharge Summary  Victor Copeland XKG:[id number] DOB: 1956/10/02 DOA: 04/10/2021  PCP: Carlean Jews, NP  Admit date: 04/10/2021 Discharge date: 04/12/2021  Admitted From: Inpatient Disposition: home  Recommendations for Outpatient Follow-up:  1. Follow up with PCP in 1-2 weeks :  Home Health:Yes Equipment/Devices:no new equip  Discharge Condition:Stable CODE STATUS:Full code Diet recommendation: Cardiac diet  Brief/Interim Summary: Victor Copeland a 65 y.o.malewith medical history significant forhypertension, type 2 diabetes mellitus, dementia, hypothyroidism, and history of CVA with right-sided motor deficits, presenting to the emergency department for evaluation of decreased appetite, lethargy, and nausea with nonbloody vomiting. History is obtained from the patient's daughter who notes that the patient had been in a memory care unit in [place] but has been at home since they recently moved to this area. He had some increased fatigue and poor appetite 8 days ago after receiving COVIDvaccination, initially seem to be improving, but then marked progression in fatigue, loss of appetite, also nausea with nonbloody vomiting over the past few days.  ED Course:Upon arrival to the ED, patient is found to be afebrile, saturating well on room air, and with stable blood pressure. EKG features sinus rhythm and chest x-ray negative for acute cardiopulmonary disease. CT abdomen pelvis notable for thickened urinary bladder. Chemistry panel features a creatinine 1.54 and glucose 299. CBC notable for leukocytosis 12,400 and slight normocytic anemia. Troponin is normal x2. Patient was given 2 L of saline, IV Rocephin, Zofran, and Reglan in the ED.  Hospital course: 1.Acute cystitis -Presented  with several days of progressive fatigue, loss of appetite, and N/V and is found to have leukocytosis, thickened urinary bladder on CT, UA compatible with infection -Urine sent for cultures  and returned with staph aureus - Rocephin started in ED, will discharge home on Keflex -White blood cell count normal, afebrile, hemodynamically stable  2.Nausea, vomiting -Abdominal exam benign, no concerning findings on CT abd/pelvis in ED -Resolved eating breakfast this morning  3.Acute kidney injury -SCr is 1.54 on admission to 1.26 with hydration -Patient will continue his home medications -PCP to follow-up for balance  4.Dementia -Calm on admission, continue Seroquel qHS, delirium precautions -Family to pursue memory care unit after discharge  5.History of CVA -Has residual right-sided deficits, no acute deficits noted by family  6.Hypothyroidism -Continue Synthroid  7.Hypertension -Patient continue his home medications without change -Further management titration through his primary care physician  8. 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)   AKI (acute kidney injury) (HCC)   Nausea & vomiting    Discharge Instructions  Discharge Instructions    Call MD for:  difficulty breathing, headache or visual disturbances   Complete by: As directed    Call MD for:  persistant nausea and vomiting   Complete by: As directed    Call MD for:  temperature >100.4   Complete by: As directed    Diet - low sodium heart healthy   Complete by: As directed    Face-to-face encounter (required for Medicare/Medicaid patients)   Complete by: As directed  I  Burke Keels certify that this patient is under my care and that I, or a nurse practitioner or physician's assistant working with me, had a face-to-face encounter that meets the physician face-to-face encounter requirements with this patient on 04/12/2021. The encounter with the patient was in whole, or in part for the following medical condition(s) which is the primary reason for home health care (List medical condition): dementia with gen weakness, and advanced dementia with confusion   The encounter with the patient was in whole, or in part, for the following medical condition, which is the primary reason for home health care: debility, gen weakness, advanced dementia   I certify that, based on my findings, the following services are medically necessary home health services: Physical therapy   Reason for Medically Necessary Home Health Services: Skilled Nursing- Change/Decline in Patient Status   My clinical findings support the need for the above services: Cognitive impairments, dementia, or mental confusion  that make it unsafe to leave home   Further, I certify that my clinical findings support that this patient is homebound due to: Mental confusion   Home Health   Complete by: As directed    To provide the following care/treatments:  PT OT     Increase activity slowly   Complete by: As directed      Allergies as of 04/12/2021   No Known Allergies     Medication List    TAKE these medications   atorvastatin 40 MG tablet Commonly known as: LIPITOR Take 1 tablet (40 mg total) by mouth daily.   cephALEXin 500 MG capsule Commonly known as: KEFLEX Take 1 capsule (500 mg total) by mouth 4 (four) times daily for 7 days.   empagliflozin 10 MG Tabs tablet Commonly known as: Jardiance Take 1 tablet (10 mg total) by mouth daily before breakfast.   levothyroxine 50 MCG tablet Commonly known as: SYNTHROID Take 1 tablet (50 mcg total) by mouth daily before breakfast.   losartan  50 MG tablet Commonly known as: COZAAR Take 1 tablet (50 mg total) by mouth daily.   metFORMIN 1000 MG tablet Commonly known as: GLUCOPHAGE Take 1 tablet (1,000 mg total) by mouth 2 (two) times daily with a meal.   metoprolol tartrate 25 MG tablet Commonly known as: LOPRESSOR Take 1 tablet (25 mg total) by mouth daily.   QUEtiapine 25 MG tablet Commonly known as: SEROquel Take 1/2 to 1 tablet at bedtime as needed What changed:   how much to take  how to take this  when to take this  additional instructions       No Known Allergies  Consultations:  None   Procedures/Studies: CT Abdomen Pelvis W Contrast  Result Date: 04/10/2021 CLINICAL DATA:  Weakness and vomiting. EXAM: CT ABDOMEN AND PELVIS WITH CONTRAST TECHNIQUE: Multidetector CT imaging of the abdomen and pelvis was performed using the standard protocol following bolus administration of intravenous contrast. CONTRAST:  24mL OMNIPAQUE IOHEXOL 300 MG/ML  SOLN COMPARISON:  75 mL omni 300 FINDINGS: Lower chest: No acute abnormality. Hepatobiliary: No focal liver abnormality is seen. No gallstones, gallbladder wall thickening, or biliary dilatation. Pancreas: Unremarkable. No pancreatic ductal dilatation or surrounding inflammatory changes. Spleen: Normal in size without focal abnormality. Adrenals/Urinary Tract: Adrenal glands are unremarkable. Kidneys are symmetric in size. No renal calculi or hydronephrosis. No mass lesion. Urinary bladder appears thick-walled. Stomach/Bowel: Stomach is within normal limits. Appendix appears normal. No evidence of bowel wall thickening, distention, or inflammatory changes. Vascular/Lymphatic: Aortic atherosclerosis. No  enlarged abdominal or pelvic lymph nodes. Reproductive: Prostate is unremarkable. Other: No abdominal wall hernia or abnormality. No abdominopelvic ascites. Musculoskeletal: No acute or significant osseous findings. IMPRESSION: 1. Urinary bladder appears thick-walled. Recommend  correlation with urinalysis to exclude cystitis. 2. Aortic atherosclerosis. Aortic Atherosclerosis (ICD10-I70.0). Electronically Signed   By: Emmaline Kluver M.D.   On: 04/10/2021 17:45   DG Chest Port 1 View  Result Date: 04/10/2021 CLINICAL DATA:  Weakness since receiving COVID vaccine 8 days ago. EXAM: PORTABLE CHEST 1 VIEW COMPARISON:  None. FINDINGS: The heart size and mediastinal contours are within normal limits. Both lungs are clear. The visualized skeletal structures are unremarkable. IMPRESSION: No active disease. Electronically Signed   By: Sherian Rein M.D.   On: 04/10/2021 14:05       Subjective:   Discharge Exam: Vitals:   04/12/21 0558 04/12/21 0942  BP: (!) 157/78 (!) 153/92  Pulse: 69 72  Resp: 20   Temp: 98 F (36.7 C)   SpO2: 99%    Vitals:   04/11/21 0926 04/11/21 2041 04/12/21 0558 04/12/21 0942  BP: 128/66 (!) 156/79 (!) 157/78 (!) 153/92  Pulse: 64 70 69 72  Resp: 13 16 20    Temp: 98 F (36.7 C) 98.1 F (36.7 C) 98 F (36.7 C)   TempSrc: Axillary Oral Oral   SpO2: 98% 100% 99%   Weight:      Height:        General: Pt is alert, awake, not in acute distress Cardiovascular: RRR, S1/S2 +, no rubs, no gallops Respiratory: CTA bilaterally, no wheezing, no rhonchi Abdominal: Soft, NT, ND, bowel sounds + Extremities: no edema, no cyanosis    The results of significant diagnostics from this hospitalization (including imaging, microbiology, ancillary and laboratory) are listed below for reference.     Microbiology: Recent Results (from the past 240 hour(s))  Urine culture     Status: Abnormal (Preliminary result)   Collection Time: 04/10/21  1:45 PM   Specimen: Urine, Random  Result Value Ref Range Status   Specimen Description   Final    URINE, RANDOM Performed at [facility], [address], [place], [place]    Special Requests   Final    NONE Performed at [facility], [address] [place], [place]    Culture (A)  Final    >=100,000 COLONIES/mL STAPHYLOCOCCUS AUREUS SUSCEPTIBILITIES TO FOLLOW Performed at [facility], [address] [place], [place]    Report Status PENDING  Incomplete  Resp Panel by RT-PCR (Flu A&B, Covid) Nasopharyngeal Swab     Status: None   Collection Time: 04/10/21  8:23 PM   Specimen: Nasopharyngeal Swab; Nasopharyngeal(NP) swabs in vial transport medium  Result Value Ref Range Status   SARS Coronavirus 2 by RT PCR NEGATIVE NEGATIVE Final    Comment: (NOTE) SARS-CoV-2 target nucleic acids are NOT DETECTED.  The SARS-CoV-2 RNA is generally detectable in upper respiratory specimens during the acute phase of infection. The lowest concentration of SARS-CoV-2 viral copies this assay can detect is 138 copies/mL. A negative result does not preclude SARS-Cov-2 infection and should not be used as the sole basis for treatment or other patient management decisions. A negative result may occur with  improper specimen collection/handling, submission of specimen other than nasopharyngeal swab, presence of viral mutation(s) within the areas targeted by this assay, and inadequate number of viral copies(<138 copies/mL). A negative result must be combined with clinical  observations, patient history, and epidemiological information. The expected result is Negative.  Fact Sheet for Patients:  BloggerCourse.com  Fact Sheet for Healthcare Providers:  SeriousBroker.it  This test is no t yet approved or cleared by the [place] FDA and  has been authorized for detection and/or diagnosis of SARS-CoV-2 by FDA under an Emergency Use Authorization (EUA). This EUA will remain  in effect (meaning this test can be used) for the duration of the COVID-19 declaration under Section 564(b)(1) of the Act, 21 U.S.C.section 360bbb-3(b)(1), unless the authorization is terminated  or  revoked sooner.       Influenza A by PCR NEGATIVE NEGATIVE Final   Influenza B by PCR NEGATIVE NEGATIVE Final    Comment: (NOTE) The Xpert Xpress SARS-CoV-2/FLU/RSV plus assay is intended as an aid in the diagnosis of influenza from Nasopharyngeal swab specimens and should not be used as a sole basis for treatment. Nasal washings and aspirates are unacceptable for Xpert Xpress SARS-CoV-2/FLU/RSV testing.  Fact Sheet for Patients: BloggerCourse.com  Fact Sheet for Healthcare Providers: SeriousBroker.it  This test is not yet approved or cleared by the [place] FDA and has been authorized for detection and/or diagnosis of SARS-CoV-2 by FDA under an Emergency Use Authorization (EUA). This EUA will remain in effect (meaning this test can be used) for the duration of the COVID-19 declaration under Section 564(b)(1) of the Act, 21 U.S.C. section 360bbb-3(b)(1), unless the authorization is terminated or revoked.  Performed at [facility], [address], [place], [place] [address]      Labs: BNP (last 3 results) No results for input(s): BNP in the last 8760 hours. Basic Metabolic Panel: Recent Labs  Lab 04/10/21 1318 04/11/21 0516 04/12/21 0414  NA 137 142 140  K 4.9 3.7 3.4*  CL 103 108 107  CO2 20* 25 25  GLUCOSE 299* 122* 186*  BUN 31* 19 18  CREATININE 1.54* 1.22 1.26*  CALCIUM 8.7* 7.8* 8.0*   Liver Function Tests: Recent Labs  Lab 04/10/21 1554  AST 19  ALT 22  ALKPHOS 78  BILITOT 0.9  PROT 7.0  ALBUMIN 3.3*   Recent Labs  Lab 04/10/21 1318  LIPASE 23   No results for input(s): AMMONIA in the last 168 hours. CBC: Recent Labs  Lab 04/10/21 1318 04/11/21 0516 04/12/21 0414  WBC 11.4* 10.1 9.4  HGB 12.8* 10.8* 10.9*  HCT 37.4* 30.8* 31.1*  MCV 85.6 84.2 84.5  PLT 302 279 254   Cardiac Enzymes: No results for input(s): CKTOTAL, CKMB, CKMBINDEX, TROPONINI in the last 168  hours. BNP: Invalid input(s): POCBNP CBG: Recent Labs  Lab 04/11/21 1154 04/11/21 1555 04/11/21 2038 04/12/21 0747 04/12/21 1142  GLUCAP 136* 158* 198* 138* 355*   D-Dimer No results for input(s): DDIMER in the last 72 hours. Hgb A1c No results for input(s): HGBA1C in the last 72 hours. Lipid Profile No results for input(s): CHOL, HDL, LDLCALC, TRIG, CHOLHDL, LDLDIRECT in the last 72 hours. Thyroid function studies Recent Labs    04/11/21 0516  TSH 11.528*   Anemia work up No results for input(s): VITAMINB12, FOLATE, FERRITIN, TIBC, IRON, RETICCTPCT in the last 72 hours. Urinalysis    Component Value Date/Time   COLORURINE YELLOW 04/10/2021 1345   APPEARANCEUR CLEAR 04/10/2021 1345   LABSPEC 1.042 (H) 04/10/2021 1345   PHURINE 5.0 04/10/2021 1345   GLUCOSEU >=500 (A) 04/10/2021 1345   HGBUR LARGE (A) 04/10/2021 1345   BILIRUBINUR NEGATIVE 04/10/2021 1345   KETONESUR 20 (A)  04/10/2021 1345   PROTEINUR 100 (A) 04/10/2021 1345   NITRITE POSITIVE (A) 04/10/2021 1345   LEUKOCYTESUR MODERATE (A) 04/10/2021 1345   Sepsis Labs Invalid input(s): PROCALCITONIN,  WBC,  LACTICIDVEN Microbiology Recent Results (from the past 240 hour(s))  Urine culture     Status: Abnormal (Preliminary result)   Collection Time: 04/10/21  1:45 PM   Specimen: Urine, Random  Result Value Ref Range Status   Specimen Description   Final    URINE, RANDOM Performed at [facility], [address], [place], [place] [address]    Special Requests   Final    NONE Performed at [facility], [address], [place], [place] [address]    Culture (A)  Final    >=100,000 COLONIES/mL STAPHYLOCOCCUS AUREUS SUSCEPTIBILITIES TO FOLLOW Performed at [facility], [address] [place], [place] [address]    Report Status PENDING  Incomplete  Resp Panel by RT-PCR (Flu A&B, Covid) Nasopharyngeal Swab     Status: None   Collection Time: 04/10/21  8:23 PM    Specimen: Nasopharyngeal Swab; Nasopharyngeal(NP) swabs in vial transport medium  Result Value Ref Range Status   SARS Coronavirus 2 by RT PCR NEGATIVE NEGATIVE Final    Comment: (NOTE) SARS-CoV-2 target nucleic acids are NOT DETECTED.  The SARS-CoV-2 RNA is generally detectable in upper respiratory specimens during the acute phase of infection. The lowest concentration of SARS-CoV-2 viral copies this assay can detect is 138 copies/mL. A negative result does not preclude SARS-Cov-2 infection and should not be used as the sole basis for treatment or other patient management decisions. A negative result may occur with  improper specimen collection/handling, submission of specimen other than nasopharyngeal swab, presence of viral mutation(s) within the areas targeted by this assay, and inadequate number of viral copies(<138 copies/mL). A negative result must be combined with clinical observations, patient history, and epidemiological information. The expected result is Negative.  Fact Sheet for Patients:  BloggerCourse.com  Fact Sheet for Healthcare Providers:  SeriousBroker.it  This test is no t yet approved or cleared by the [place] FDA and  has been authorized for detection and/or diagnosis of SARS-CoV-2 by FDA under an Emergency Use Authorization (EUA). This EUA will remain  in effect (meaning this test can be used) for the duration of the COVID-19 declaration under Section 564(b)(1) of the Act, 21 U.S.C.section 360bbb-3(b)(1), unless the authorization is terminated  or revoked sooner.       Influenza A by PCR NEGATIVE NEGATIVE Final   Influenza B by PCR NEGATIVE NEGATIVE Final    Comment: (NOTE) The Xpert Xpress SARS-CoV-2/FLU/RSV plus assay is intended as an aid in the diagnosis of influenza from Nasopharyngeal swab specimens and should not be used as a sole basis for treatment. Nasal washings and aspirates are  unacceptable for Xpert Xpress SARS-CoV-2/FLU/RSV testing.  Fact Sheet for Patients: BloggerCourse.com  Fact Sheet for Healthcare Providers: SeriousBroker.it  This test is not yet approved or cleared by the [place] FDA and has been authorized for detection and/or diagnosis of SARS-CoV-2 by FDA under an Emergency Use Authorization (EUA). This EUA will remain in effect (meaning this test can be used) for the duration of the COVID-19 declaration under Section 564(b)(1) of the Act, 21 U.S.C. section 360bbb-3(b)(1), unless the authorization is terminated or revoked.  Performed at [facility], [address] [place], [place] [address]      Time coordinating discharge: Over 30 minutes  SIGNED:  Burke Keelshristopher Amarri Michaelson, MD  [facility] 04/12/2021, 12:01 PM Pager   If 7PM-7AM, please contact night-coverage [email or web address] Password [email or web address]

## 2021-04-12 NOTE — Discharge Instructions (Addendum)
List of Assisted Living Facilities in [place].  [address] of [place] (125) [facility] SITE: [address] [place], [place], [address] [phone] Fax: [phone] Star Rating Certificate # of Stars: N/A  [facility] Operations, [facility] SITE: [address], [place], [place], [address] [phone] Fax: [phone] Star Rating Certificate # of Stars: 3  [facility] [place] [facility] [place] (48) [facility] SITE: [address] [place], [place], [address] [phone] Fax: [phone] Star Rating Certificate # of Stars: N/A  [facility] [place] ([address] [place], [place] SITE: [address] [place] [address] [place], [place], [address] [phone] Fax: [phone] Star Rating Certificate # of Stars: 0  [place] (70) [place], [place] SITE: [address] [place], [place], [address] [phone] Fax: [phone] Star Rating Certificate # of Stars: 3  [place] ([address]) [address] [place], [place] SITE: [address], [place], [place], [address] [phone] Fax: [phone] Star Rating Certificate # of Stars: 2  [place] [facility] (45) [address] [place], [place] SITE: [address] [place], [place], [address] [phone] Fax: [phone] Star Rating Certificate # of Stars: 4  [place] at [facility] [phone] [place], [place] SITE: [address] [place], [place], [address] [phone] Fax: [phone] Star Rating Certificate # of Stars: 3  [facility] (60) [place], [place] SITE: [address] [place], [place], [address] [phone] Fax: [phone] Star Rating Certificate # of Stars: 3  [place] [facility] [place] (100) [place] [facility] SITE: [address] [place], [place], [address] [phone] Fax:  [phone] Star Rating Certificate # of Stars: 3  [place] [phone]) [facility] SITE: [address] [place], [place], [address] [phone] Fax: [phone] Star Rating Certificate # of Stars: 3  [place] ([address]) [facility] [place], [place] SITE: [address], [place], [place], [address] [phone] Fax: [phone] Star Rating Certificate # of Stars: 4  [facility] [phone] [facility], [facility] SITE: [phone] [place], [place], [address] [phone] Fax: [phone] Star Rating Certificate # of Stars: 2 Page: 10  Adult Care Homes / Homes for the Aged Licensed by the [place] [facility] Department of Health and [employer] of Health Service Regulation As of 03/2021  [place] [phone] [facility] SITE: [address], [place], [place], [address] [phone] Fax: [phone] Star Rating Certificate # of Stars: 3  [place] at [facility] (28) [facility] SITE: [address] [place], [place], [address] [phone] Fax: [phone] Star Rating Certificate # of Stars: 4  [facility] (30) [facility] SITE: [address], [place], [place],  [address] [phone] Fax: [phone] Star Rating Certificate # of Stars: 4  [place] [facility] (105) [place], [place] SITE: [address] [place], [place], [address] [phone] Fax: [phone] Star Rating Certificate # of Stars: 3  [facility] [place] ([place]) (102) [facility], [facility] SITE: [address] [place], [place], [address] [phone] Fax: [phone] Star Rating Certificate # of Stars: 3  [facility] [place]  (65) [facility], [facility] SITE: [address],  [place], [place], [address] [phone] Fax: [phone] Star Rating Certificate # of Stars: 3  [facility] [phone]) [facility] [place], [place] SITE: [address], [place], [place], [address] [phone] Fax: [phone] Star Rating Certificate # of Stars: 3  [facility] [phone]) [facility], [facility] SITE: [address] [place], [place], [address] [phone] Fax: [phone] Star Rating Certificate # of Stars: 3  [place] [phone]) [facility], [facility] SITE: [address], [place], [place], [address] [phone] Fax: [phone] Star Rating Certificate # of Stars: 4  [place] (60) [address] [place] SITE: [address] [place], [place], [address] [phone] Fax: [phone] Star Rating Certificate # of Stars: 2  [facility] (18) [place] SITE: [address] [place], [place], [address] [phone] Fax: [phone] Star Rating Certificate # of Stars: 3  [facility] [phone]) [place] [facility] [place] SITE: [address] [place], [place], [address] [phone] Fax: [phone] Star Rating Certificate # of Stars: 3

## 2021-04-12 NOTE — Progress Notes (Signed)
This RN contacted pt's daughter regarding d/c. Daughter to come at 1500 to review discharge instructions and take pt home. IV removed and pt dressed. Waiting for daughter's arrival.

## 2021-04-12 NOTE — Plan of Care (Signed)

## 2021-04-12 NOTE — TOC Transition Note (Addendum)
Transition of Care [facility]) - CM/SW Discharge Note   Patient Details  Name: Victor Copeland MRN: [id number] Date of Birth: [birth year]  Transition of Care [facility]) CM/SW Contact:  Darleene Cleaver, LCSW Phone Number: 04/12/2021, 12:39 PM   Clinical Narrative:     Patient will be going home with home health through [facility].  CSW signing off please reconsult with any other social work needs, home health agency has been notified of planned discharge.  Patient's daughter did not have a preference for agencies.  Per daughter, they will continue looking for memory care facilities in the area.  Daughter also stated they do not need any equipment.  CSW will include a list of memory care agencies on patient's AVS.   Final next level of care: Home w Home Health Services Barriers to Discharge: Barriers Resolved   Patient Goals and CMS Choice Patient states their goals for this hospitalization and ongoing recovery are:: To return back home with home health services. CMS Medicare.gov Compare Post Acute Care list provided to:: Patient Represenative (must comment) Choice offered to / list presented to : Adult Children  Discharge Placement  Home with home health services.                     Discharge Plan and Services                          HH Arranged: PT,OT HH Agency: [facility] Date [facility] Agency Contacted: 04/12/21 Time HH Agency Contacted: 1200 Representative spoke with at [facility] Agency: Elnita Maxwell  Social Determinants of Health (SDOH) Interventions     Readmission Risk Interventions No flowsheet data found.

## 2021-04-12 NOTE — Evaluation (Signed)
Occupational Therapy Evaluation Patient Details Name: Victor Copeland MRN: [id number] DOB: 12-05-1956 Today's Date: 04/12/2021    History of Present Illness Pt admitted from home 2* weakness, N/V and dx with acute cystitis and UTI with hematuria.  Pt with hx of DM, dementia and CVA with residual R side deficits   Clinical Impression   No family available and pt unable to state PLOF other than he used a walker and lived with his daughter. Pt with urinary incontinence and had not called for assistance upon OT's arrival. Max to total assist for pericare and to change wet gown. Transferred to chair with min assist. Pt able to drink juice with straw and set up. Nursing aware condom cath off. Will follow acutely and update discharge recommendations as appropriate.     Follow Up Recommendations  Home health OT    Equipment Recommendations  None recommended by OT    Recommendations for Other Services       Precautions / Restrictions Precautions Precautions: Fall Precaution Comments: R hemiparesis Restrictions Weight Bearing Restrictions: No      Mobility Bed Mobility Overal bed mobility: Needs Assistance Bed Mobility: Supine to Sit     Supine to sit: Mod assist     General bed mobility comments: assist for LEs over EOB and to raise trunk, pt able to scoot hips to EOB with increased time    Transfers Overall transfer level: Needs assistance Equipment used: 1 person hand held assist Transfers: Sit to/from [employer] Sit to Stand: Min assist Stand pivot transfers: Min assist            Balance Overall balance assessment: Needs assistance   Sitting balance-Leahy Scale: Fair       Standing balance-Leahy Scale: Poor Standing balance comment: posterior drift                           ADL either performed or assessed with clinical judgement   ADL Overall ADL's : Needs assistance/impaired Eating/Feeding: Set up;Sitting   Grooming: Wash/dry  face;Moderate assistance;Bed level Grooming Details (indicate cue type and reason): poor thoroughness, only washes mouth and chin Upper Body Bathing: Maximal assistance;Sitting   Lower Body Bathing: Total assistance;Sit to/from stand   Upper Body Dressing : Maximal assistance;Sitting Upper Body Dressing Details (indicate cue type and reason): changed soiled gown Lower Body Dressing: Total assistance;Sit to/from stand   Toilet Transfer: Minimal assistance;Stand-pivot;BSC   Toileting- Clothing Manipulation and Hygiene: Total assistance;Sit to/from stand               Vision Patient Visual Report: No change from baseline       Perception Perception Comments: vision not specifically assessed   Praxis      Pertinent Vitals/Pain Pain Assessment: Faces Faces Pain Scale: No hurt     Hand Dominance Left (due to hemiparesis)   Extremity/Trunk Assessment Upper Extremity Assessment Upper Extremity Assessment: RUE deficits/detail RUE Deficits / Details: Residual deficits from CVA RUE Coordination: decreased fine motor;decreased gross motor   Lower Extremity Assessment Lower Extremity Assessment: Defer to PT evaluation   Cervical / Trunk Assessment Cervical / Trunk Assessment: Other exceptions Cervical / Trunk Exceptions: weakness   Communication Communication Communication: Prefers language other than English   Cognition Arousal/Alertness: Awake/alert Behavior During Therapy: WFL for tasks assessed/performed;Flat affect Overall Cognitive Status: History of cognitive impairments - at baseline  General Comments: Follows simple commands and answers simple questions appropriately   General Comments       Exercises     Shoulder Instructions      Home Living Family/patient expects to be discharged to:: Private residence Living Arrangements: Children                               Additional Comments: Pt  unable to provide details but does state he lives with dtr and uses a RW      Prior Functioning/Environment Level of Independence: Needs assistance  Gait / Transfers Assistance Needed: RW and assist ADL's / Homemaking Assistance Needed: likely assisted for ADL and all IADL Communication / Swallowing Assistance Needed: Aphasia vs limited English, follow commands with multimodal cues.          OT Problem List: Decreased strength;Impaired balance (sitting and/or standing);Decreased coordination;Decreased cognition;Decreased safety awareness;Decreased knowledge of use of DME or AE;Impaired UE functional use      OT Treatment/Interventions: Self-care/ADL training;DME and/or AE instruction;Patient/family education;Balance training;Therapeutic activities    OT Goals(Current goals can be found in the care plan section) Acute Rehab OT Goals Patient Stated Goal: Get OOB OT Goal Formulation: Patient unable to participate in goal setting Time For Goal Achievement: 04/26/21 Potential to Achieve Goals: Fair ADL Goals Pt Will Perform Grooming: with min assist;sitting Pt Will Perform Upper Body Bathing: sitting;with mod assist Pt Will Perform Upper Body Dressing: with mod assist;sitting Pt Will Transfer to Toilet: with min assist;ambulating Pt Will Perform Toileting - Clothing Manipulation and hygiene: with mod assist;sit to/from stand Additional ADL Goal #1: Pt will perform bed mobility with min assist in preparation for ADL.  OT Frequency: Min 2X/week   Barriers to D/C:            Co-evaluation              AM-PAC OT "6 Clicks" Daily Activity     Outcome Measure Help from another person eating meals?: A Little Help from another person taking care of personal grooming?: A Lot Help from another person toileting, which includes using toliet, bedpan, or urinal?: Total Help from another person bathing (including washing, rinsing, drying)?: A Lot Help from another person to put on and  taking off regular upper body clothing?: A Lot Help from another person to put on and taking off regular lower body clothing?: Total 6 Click Score: 11   End of Session Equipment Utilized During Treatment: Gait belt Nurse Communication: Mobility status;Other (comment) (aware condom cath is off)  Activity Tolerance: Patient tolerated treatment well Patient left: in chair;with call bell/phone within reach;with chair alarm set  OT Visit Diagnosis: Unsteadiness on feet (R26.81);Other abnormalities of gait and mobility (R26.89);Muscle weakness (generalized) (M62.81);Other symptoms and signs involving cognitive function;Hemiplegia and hemiparesis Hemiplegia - Right/Left: Right Hemiplegia - dominant/non-dominant: Dominant Hemiplegia - caused by: Cerebral infarction                Time: 1010-1038 OT Time Calculation (min): 28 min Charges:  OT General Charges $OT Visit: 1 Visit OT Evaluation $OT Eval Moderate Complexity: 1 Mod OT Treatments $Self Care/Home Management : 8-22 mins  Martie Round, OTR/L Acute Rehabilitation Services Pager: [phone] Office: [phone]  Evern Bio 04/12/2021, 10:50 AM

## 2021-04-12 NOTE — Progress Notes (Signed)
Nutrition Brief Note  Patient identified on the Malnutrition Screening Tool (MST) Report  Wt Readings from Last 15 Encounters:  04/10/21 45.5 kg  03/17/21 46.3 kg  02/22/21 46.3 kg    Body mass index is 19.61 kg/m. Patient meets criteria for normal weight based on current BMI. Weight on 4/16 was 100 lb and weight on 2/28 was 102 lb. This indicates 2 lb weight loss (2% body weight) in the past 1.5 months; not significant for time frame.   Skin WDL. Labs and medications reviewed.   Patient is noted to be a/o to self and place only and require interpreter for Hmong.   Discharge order and discharge summary for discharge home entered earlier today.  Current diet order is Heart Healthy/Carb Modified and he ate 100% of breakfast and 50% of lunch today.   No nutrition interventions warranted at this time. If nutrition issues arise, please consult RD.      Trenton Gammon, MS, RD, LDN, CNSC Inpatient Clinical Dietitian RD pager # available in [facility]  After hours/weekend pager # available in [facility]

## 2021-04-14 LAB — URINE CULTURE: Culture: >=100000 — AB

## 2021-04-28 ENCOUNTER — Telehealth: Payer: Self-pay | Admitting: Nurse Practitioner

## 2021-04-28 NOTE — Telephone Encounter (Signed)
Patient also needs a capability form filled out. Patient's daughter dropped the [facility] paperwork yesterday, thanks.

## 2021-05-06 ENCOUNTER — Telehealth: Payer: Self-pay | Admitting: Nurse Practitioner

## 2021-05-06 NOTE — Telephone Encounter (Signed)
Patient daughter called for status of paperwork. Please advise. Thank you

## 2021-05-06 NOTE — Telephone Encounter (Signed)
Herbert Seta is working on her paper work

## 2021-05-13 ENCOUNTER — Telehealth: Payer: Self-pay | Admitting: Nurse Practitioner

## 2021-05-13 NOTE — Telephone Encounter (Signed)
April R.N. with [facility] called for a verbal approval for the following:  (1) wk 4 /for Med eval BP and Diabetes education for all  Please call and advise. Thank you

## 2021-05-13 NOTE — Telephone Encounter (Signed)
[facility] home care Vernona Rieger the PT- they have discharged [place] therapy with max potential- family is planning to place patient in facility for memory care unit

## 2021-05-14 ENCOUNTER — Ambulatory Visit: Payer: Medicare Other | Admitting: Nurse Practitioner

## 2021-05-15 NOTE — Telephone Encounter (Signed)
Yes. All approved. Thank you.

## 2021-05-17 NOTE — Telephone Encounter (Signed)
Called and spoke to April the RN she is aware of the approval for pt. Victor Copeland

## 2021-05-18 ENCOUNTER — Telehealth: Payer: Self-pay | Admitting: Nurse Practitioner

## 2021-05-18 NOTE — Telephone Encounter (Signed)
Patient's daughter called in asking for an update on her father's FMLA paperwork and I don't see any updates. Please advise, thanks.

## 2021-05-18 NOTE — Telephone Encounter (Signed)
Are these papers all in the folder? I didn't see it in the rack any more. Will you let them know I am working on it, but have been out due to illness. That way they understand why it is taking longer than it usually would. Thanks.

## 2021-05-20 NOTE — Telephone Encounter (Signed)
Forms have been completed by provider. Copies made and given to providers RMA. Originals scanned into patients chart. Patients daughter is aware forms are ready for pick up. Originals placed in accordion file for pick up. AS, CMA

## 2021-05-25 ENCOUNTER — Ambulatory Visit (INDEPENDENT_AMBULATORY_CARE_PROVIDER_SITE_OTHER): Payer: Medicare Other | Admitting: Nurse Practitioner

## 2021-05-25 ENCOUNTER — Encounter: Payer: Self-pay | Admitting: Nurse Practitioner

## 2021-05-25 ENCOUNTER — Other Ambulatory Visit: Payer: Self-pay

## 2021-05-25 VITALS — BP 136/76 | HR 62 | Temp 98.5°F | Wt 100.0 lb

## 2021-05-25 DIAGNOSIS — E119 Type 2 diabetes mellitus without complications: Secondary | ICD-10-CM | POA: Diagnosis not present

## 2021-05-25 DIAGNOSIS — I10 Essential (primary) hypertension: Secondary | ICD-10-CM | POA: Diagnosis not present

## 2021-05-25 DIAGNOSIS — F321 Major depressive disorder, single episode, moderate: Secondary | ICD-10-CM | POA: Diagnosis not present

## 2021-05-25 DIAGNOSIS — F0281 Dementia in other diseases classified elsewhere with behavioral disturbance: Secondary | ICD-10-CM | POA: Diagnosis not present

## 2021-05-25 DIAGNOSIS — Z111 Encounter for screening for respiratory tuberculosis: Secondary | ICD-10-CM

## 2021-05-25 DIAGNOSIS — F02818 Dementia in other diseases classified elsewhere, unspecified severity, with other behavioral disturbance: Secondary | ICD-10-CM

## 2021-05-25 LAB — POCT GLYCOSYLATED HEMOGLOBIN (HGB A1C): Hemoglobin A1C: 8.5 % — AB (ref 4.0–5.6)

## 2021-05-25 MED ORDER — MIRTAZAPINE 7.5 MG PO TABS: ORAL_TABLET | ORAL | 2 refills | Status: DC

## 2021-05-25 MED ORDER — EMPAGLIFLOZIN 25 MG PO TABS: 25.0000 mg | ORAL_TABLET | Freq: Every day | ORAL | 2 refills | Status: DC

## 2021-05-25 NOTE — Progress Notes (Signed)
Established Patient Office Visit  Subjective:  Patient ID: Victor Copeland, male    DOB: [birth year]  Age: 65 y.o. MRN: [id number]  CC:  Chief Complaint  Patient presents with  . Follow-up  . Diabetes    HPI Victor Copeland presents for follow up of diabetes. Added Jardiance to his medication regimen at his initial visit to help control blood sugars. His HgbA1c has improved from 11.8 to 8.5 today. He is tolerating the medication well. His daughter admits that patient seems to have decreased appetite.  With the decreased appetite, it makes it difficult for family members to administer his medications. States that he is often spitting out medications and not taking them at all. daughter also states that patient seems to be a bit more irritable, especially in the evenings.  The patient's daughter denies that patient has other concerns or complaints. She states that he denies chest pain, chest pressure, or shortness of breath. He denies headaches or visual disturbances. He denies abdominal pain, nausea, vomiting, or changes in bowel or bladder habits.   Past Medical History:  Diagnosis Date  . DM (diabetes mellitus) (HCC)   . HLD (hyperlipidemia)   . HTN (hypertension)   . Hypothyroid   . Stroke [facility])     History reviewed. No pertinent surgical history.  Family History  Problem Relation Age of Onset  . Diabetes Mother     Social History   Socioeconomic History  . Marital status: Married    Spouse name: Billey Chang  . Number of children: 3  . Years of education: Not on file  . Highest education level: Not on file  Occupational History  . Occupation: disabled  Tobacco Use  . Smoking status: Never Smoker  . Smokeless tobacco: Never Used  Substance and Sexual Activity  . Alcohol use: Not Currently  . Drug use: Not Currently  . Sexual activity: Not Currently  Other Topics Concern  . Not on file  Social History Narrative  . Not on file   Social Determinants of Health   Financial  Resource Strain: Not on file  Food Insecurity: Not on file  Transportation Needs: Not on file  Physical Activity: Not on file  Stress: Not on file  Social Connections: Not on file  Intimate Partner Violence: Not on file    Outpatient Medications Prior to Visit  Medication Sig Dispense Refill  . atorvastatin (LIPITOR) 40 MG tablet Take 1 tablet (40 mg total) by mouth daily. 90 tablet 0  . levothyroxine (SYNTHROID) 50 MCG tablet Take 1 tablet (50 mcg total) by mouth daily before breakfast. 90 tablet 0  . losartan (COZAAR) 50 MG tablet Take 1 tablet (50 mg total) by mouth daily. 90 tablet 0  . metFORMIN (GLUCOPHAGE) 1000 MG tablet Take 1 tablet (1,000 mg total) by mouth 2 (two) times daily with a meal. 180 tablet 0  . metoprolol tartrate (LOPRESSOR) 25 MG tablet Take 1 tablet (25 mg total) by mouth daily. 90 tablet 0  . QUEtiapine (SEROQUEL) 25 MG tablet Take 1/2 to 1 tablet at bedtime as needed (Patient taking differently: Take 25 mg by mouth at bedtime.) 30 tablet 2  . empagliflozin (JARDIANCE) 10 MG TABS tablet Take 1 tablet (10 mg total) by mouth daily before breakfast. 90 tablet 0   No facility-administered medications prior to visit.    No Known Allergies  ROS Review of Systems  Constitutional: Positive for appetite change. Negative for chills, fatigue and fever.  Patient's daughter states that patient's appetite has been decreased over past few weeks.   HENT: Negative for congestion, postnasal drip, rhinorrhea, sinus pressure, sinus pain and sore throat.   Respiratory: Negative for cough, shortness of breath and wheezing.   Cardiovascular: Negative for chest pain and palpitations.  Gastrointestinal: Negative for constipation, diarrhea, nausea and vomiting.       Decreased appetite.   Endocrine: Negative for cold intolerance, heat intolerance, polydipsia and polyuria.       Blood sugars have improved. His HgbA1c is 8.5, down from 11.8 at his initial visit   Musculoskeletal:  Negative for back pain and myalgias.  Skin: Negative for rash.  Allergic/Immunologic: Negative.   Neurological: Positive for weakness. Negative for dizziness and headaches.       Progressive memory loss and dementia.   Psychiatric/Behavioral: Positive for agitation and behavioral problems. The patient is nervous/anxious.       Objective:    Physical Exam Vitals and nursing note reviewed.  Constitutional:      Appearance: Normal appearance. He is well-developed.  HENT:     Head: Normocephalic and atraumatic.     Nose: Nose normal.  Eyes:     Pupils: Pupils are equal, round, and reactive to light.  Neck:     Vascular: No carotid bruit.  Cardiovascular:     Rate and Rhythm: Normal rate and regular rhythm.     Pulses: Normal pulses.     Heart sounds: Normal heart sounds.  Pulmonary:     Effort: Pulmonary effort is normal.     Breath sounds: Normal breath sounds.  Abdominal:     General: Bowel sounds are normal.     Palpations: Abdomen is soft.     Tenderness: There is no abdominal tenderness.  Musculoskeletal:        General: Normal range of motion.     Cervical back: Normal range of motion and neck supple.     Comments: Generalized weakness. Patient is currently in wheelchair.   Skin:    General: Skin is warm and dry.     Capillary Refill: Capillary refill takes less than 2 seconds.  Neurological:     Mental Status: He is alert and oriented to person, place, and time. Mental status is at baseline.  Psychiatric:        Mood and Affect: Mood normal.        Behavior: Behavior normal.     Comments: At baseline. Difficult to assess in office as patient does not answer questions appropriately.      Today's Vitals   05/25/21 1325  BP: 136/76  Pulse: 62  Temp: 98.5 F (36.9 C)  SpO2: 100%  Weight: 100 lb (45.4 kg)   Body mass index is 19.53 kg/m.   Wt Readings from Last 3 Encounters:  05/25/21 100 lb (45.4 kg)  04/10/21 100 lb 6.4 oz (45.5 kg)  03/17/21 102 lb  (46.3 kg)     Health Maintenance Due  Topic Date Due  . Pneumococcal Vaccine 48-2 Years old (1 of 2 - PPSV23) Never done  . FOOT EXAM  Never done  . OPHTHALMOLOGY EXAM  Never done  . Hepatitis C Screening  Never done  . TETANUS/TDAP  Never done  . COLONOSCOPY (Pts 45-70yrs Insurance coverage will need to be confirmed)  Never done  . Zoster Vaccines- Shingrix (1 of 2) Never done  . PNA vac Low Risk Adult (1 of 2 - PCV13) Never done    There are no  preventive care reminders to display for this patient.  Lab Results  Component Value Date   TSH 11.528 (H) 04/11/2021   Lab Results  Component Value Date   WBC 9.4 04/12/2021   HGB 10.9 (L) 04/12/2021   HCT 31.1 (L) 04/12/2021   MCV 84.5 04/12/2021   PLT 254 04/12/2021   Lab Results  Component Value Date   NA 140 04/12/2021   K 3.4 (L) 04/12/2021   CO2 25 04/12/2021   GLUCOSE 186 (H) 04/12/2021   BUN 18 04/12/2021   CREATININE 1.26 (H) 04/12/2021   BILITOT 0.9 04/10/2021   ALKPHOS 78 04/10/2021   AST 19 04/10/2021   ALT 22 04/10/2021   PROT 7.0 04/10/2021   ALBUMIN 3.3 (L) 04/10/2021   CALCIUM 8.0 (L) 04/12/2021   ANIONGAP 8 04/12/2021   No results found for: CHOL No results found for: HDL No results found for: LDLCALC No results found for: TRIG No results found for: CHOLHDL Lab Results  Component Value Date   HGBA1C 8.5 (A) 05/25/2021      Assessment & Plan:  1. Type 2 diabetes mellitus without complication, without long-term current use of insulin (HCC) Improved. HgbA1c 8.5 today, down fro 11.8 at initial visit. Goal is to have HgbA1c at or under 7.0. increased Jardiance to 25mg  daily. Continue other diabetic medication as prescribed. Continue with use of sliding scale insulin prior to meals per sliding scale instructions.  - POCT glycosylated hemoglobin (Hb A1C) - empagliflozin (JARDIANCE) 25 MG TABS tablet; Take 1 tablet (25 mg total) by mouth daily before breakfast.  Dispense: 30 tablet; Refill: 2  2.  Essential hypertension Stable. Continue bp medication as prescribed   3. Dementia associated with other underlying disease with behavioral disturbance [facility](HCC) [facility], [facility] care unit.   4. Depression, major, single episode, moderate (HCC) Add mirtazapine 7.5mg  daily with supper. Continue other medication as prescribed. Will monitor and reassess at next visit.  - mirtazapine (REMERON) 7.5 MG tablet; Take 1 tablet po QPM with supper  Dispense: 30 tablet; Refill: 2  5. Screening-pulmonary TB TB skin test administered today. The patient will have to return to visit in next two to three days to interpret test results.   - TB Skin Test  Problem List Items Addressed This Visit      Cardiovascular and Mediastinum   Essential hypertension     Endocrine   Type 2 diabetes mellitus without complication (HCC) - Primary   Relevant Medications   empagliflozin (JARDIANCE) 25 MG TABS tablet   Other Relevant Orders   POCT glycosylated hemoglobin (Hb A1C) (Completed)     Nervous and Auditory   Dementia associated with other underlying disease with behavioral disturbance (HCC)   Relevant Medications   mirtazapine (REMERON) 7.5 MG tablet     Other   Depression, major, single episode, moderate (HCC)   Relevant Medications   mirtazapine (REMERON) 7.5 MG tablet   Screening-pulmonary TB   Relevant Orders   TB Skin Test (Completed)      Meds ordered this encounter  Medications  . DISCONTD: empagliflozin (JARDIANCE) 25 MG TABS tablet    Sig: Take 1 tablet (25 mg total) by mouth daily before breakfast.    Dispense:  30 tablet    Refill:  2    Please note increased dose to 25mg  daily    Order Specific Question:   Supervising Provider    Answer:   Nani GasserMETHENEY, CATHERINE D [[id number]]  . mirtazapine (  REMERON) 7.5 MG tablet    Sig: Take 1 tablet po QPM with supper    Dispense:  30 tablet    Refill:  2    Order Specific Question:   Supervising  Provider    Answer:   Nani Gasser D [[id number]]  . empagliflozin (JARDIANCE) 25 MG TABS tablet    Sig: Take 1 tablet (25 mg total) by mouth daily before breakfast.    Dispense:  30 tablet    Refill:  2    Please note increased dose to 25mg  daily    Order Specific Question:   Supervising Provider    Answer:   D [[id number]]    Follow-up: Return in about 3 months (around 08/25/2021) for dm - check HgbA1c, mood - nurse visit needed in 2 or 3 days to read TB skin test .    08/27/2021, NP

## 2021-05-25 NOTE — Patient Instructions (Signed)
Supporting Someone With Depression Depression is a mental health condition that affects the way a person feels, thinks, and handles daily activities such as eating, sleeping, and working. When a person has depression, his or her condition can affect others around him or her, such as friends and family members. Friends and family can help by offering support and understanding. What do I need to know about this condition? The main symptoms of depression are:  Constant depressed or irritable mood.  Loss of interest in things and activities that were enjoyed in the past. Other symptoms of depression include:  Fatigue.  Sleeping too much or too little.  Difficulty falling asleep, or waking up early and not being able to get back to sleep.  Difficulty concentrating or making decisions.  Changes in appetite and weight.  Staying away from others (isolating oneself).  Expressing feelings of guilt.  Expressing suicidal thoughts or feelings. What do I need to know about the treatment options? This condition is usually treated by mental health providers such as psychologists, psychiatrists, and clinical social workers. Treatment may include one or more of the following:  Psychotherapy, also called talk therapy or counseling. Types of psychotherapy include individual or group therapy, and they usually involve the following approaches: ? Cognitive behavioral therapy (CBT). This type of therapy teaches a person how to recognize feelings, thoughts, and behaviors that contribute to depression. The person is taught to make a choice about how to respond to these feelings, thoughts, and behaviors so that he or she can experience fewer symptoms. ? Interpersonal therapy (IPT). This helps to improve the way that someone with depression relates to and communicates with others. This type of therapy may involve caregivers and loved ones. ? Family therapy. This treatment helps family members to communicate and  deal with conflict in healthy ways.  Medicine. This is often used to help with certain emotions and behaviors. Combining medicine and therapy is often the most effective approach. The following lifestyle changes may also help with managing symptoms of depression:  Limiting alcohol and drug use.  Exercising regularly.  Getting enough good-quality sleep.  Making healthy eating choices.  Reducing distressing situations.  Spending time outside.  Following regular daily routines.   How can I support my loved one? Talk about the condition Good communication is the key to supporting your friend or family member. Here are a few things to keep in mind:  Ask your loved one how you can support him or her.  Respect your loved one's right to make decisions.  Be careful about too much prodding. Try not to overdo reminders to an adult friend or family member about things like taking medicines. Ask how your loved one prefers that you help.  Be encouraging and offer emotional support. This can help to lower stress. Even saying something simple to comfort your loved one may help.  Never ignore comments about suicide, and do not try to avoid the subject of suicide. Talking about suicide will not make your loved one want to act on it. You or your loved one can reach out 24 hours a day to get free, private support (on the phone or a live online chat) from a suicide crisis helpline, such as the National Suicide Prevention Lifeline at (716)140-2582.  Listening is very important. Be available if your friend or family member wants to talk. Make an effort to acknowledge his or her feelings and stay calm and realistic. Find support and resources A health care provider may  be able to recommend mental health resources that are available online or over the phone. You could start with:  Government sites such as the Substance Abuse and Mental Health Services Administration (SAMHSA): SkateOasis.com.ptwww.samhsa.gov  National  mental health organizations such as the [employer] on Mental Illness (NAMI): www.nami.org You may also consider:  Joining self-help and support groups, not only for your friend or family member, but also for yourself. People in these peer and family support groups understand what you and your loved one are going through. They can help you feel a sense of hope and connect you with local resources to help you learn more.  Attending family therapy with your loved one. General support  Make an effort to learn all you can about depression.  Help your loved one follow his or her treatment plan as directed by health care providers. This could mean driving him or her to therapy sessions or suggesting ways to cope with stress.  Ask your loved one if you may join him or her for a therapy session or go with him or her to health care visits. Joining your loved one with his or her permission can give you an opportunity to learn how to be more supportive.  Include your loved one in activities. Invite her or him to go for walks and outings. At first, your loved one may not want to, but keep trying.  Be patient and do not expect your loved one to do too much too soon.  Help with daily responsibilities, such as laundry or meals. Sometimes daily tasks seem overwhelming to a person with depression.  Remember that your support really matters. Social support is a huge benefit for someone who is coping with depression. How can I create a safe environment? If your loved one feels unable to control his or her behavior, it may be necessary to take steps to keep his or her home safe. Such steps may include:  Locking up alcohol and prescription pills that your loved one may turn to. Count prescription pills often. You may want to consider removing alcohol from the home.  Removing or locking up guns and other weapons. If you do not have a safe place to keep a gun, local law enforcement may store a gun for  you.  Making a written crisis plan. Include important phone numbers, such as the local crisis intervention team. Make sure that: ? The person with depression knows about this plan. ? Everyone who has regular contact with that person knows about the plan and knows what to do in an emergency. How should I care for myself? It is important to find ways to care for your body, mind, and well-being while supporting someone with depression.  Spend time with friends and family. Find someone you can talk to who will also help you work on using coping skills to manage stress. Consider seeking therapy for yourself.  Try to maintain your normal routines. This can help you remember that your life is about more than your loved one's condition.  Understand what your limits are. Say "no" to requests or events that lead to a schedule that is too busy.  Make time for activities that help you relax, and try to not feel guilty about taking time for yourself.  Consider trying meditation and deep breathing exercises to lower stress.  Get plenty of sleep.  Exercise, even if it is just taking a short walk a few times a week.   What are some  signs that the condition is getting worse? Signs that your loved one's condition may be getting worse include:  Symptoms returning or getting worse.  Not taking medicines or attending therapy as prescribed.  Having more trouble sleeping or doing everyday activities.  Withdrawal from friends and family. Get help right away if:  Your loved one expresses serious thoughts about self-harm or about hurting others.  Your loved one sees, hears, tastes, smells, or feels things that are not present (hallucinations). If you ever feel like your loved one may hurt himself or herself or others, or may have thoughts about taking his or her own life, get help right away. You can go to your nearest emergency department or call:  Your local emergency services (911 in the [place]  A  suicide crisis helpline, such as the National Suicide Prevention Lifeline at 450-679-9026. This is open 24 hours a day. Summary  Depression is a mood disorder that affects the way a person feels, thinks, and handles daily activities.  Depression is usually treated by mental health professionals. It may include psychotherapy, medicine, lifestyle changes, or a combination of these approaches.  When you support a loved one with depression, it is important to keep yourself healthy and safe.  Get help right away if your loved one expresses serious thoughts about self-harm. This information is not intended to replace advice given to you by your health care provider. Make sure you discuss any questions you have with your health care provider. Document Revised: 06/04/2020 Document Reviewed: 06/04/2020 Elsevier Patient Education  2021 Elsevier Inc.  Major Depressive Disorder, Adult Major depressive disorder is a mental health condition. This disorder affects feelings. It can also affect the body. Symptoms of this condition last most of the day, almost every day, for 2 weeks. This disorder can affect:  Relationships.  Daily activities, such as work and school.  Activities that you normally like to do. What are the causes? The cause of this condition is not known. The disorder is likely caused by a mix of things, including:  Your personality, such as being a shy person.  Your behavior, or how you act toward others.  Your thoughts and feelings.  Too much alcohol or drugs.  How you react to stress.  Health and mental problems that you have had for a long time.  Things that hurt you in the past (trauma).  Big changes in your life, such as divorce. What increases the risk? The following factors may make you more likely to develop this condition:  Having family members with depression.  Being a woman.  Problems in the family.  Low levels of some brain chemicals.  Things that  caused you pain as a child, especially if you lost a parent or were abused.  A lot of stress in your life, such as from: ? Living without basic needs of life, such as food and shelter. ? Being treated poorly because of race, sex, or religion (discrimination).  Health and mental problems that you have had for a long time. What are the signs or symptoms? The main symptoms of this condition are:  Being sad all the time.  Being grouchy all the time.  Loss of interest in things and activities. Other symptoms include:  Sleeping too much or too little.  Eating too much or too little.  Gaining or losing weight, without knowing why.  Feeling tired or having low energy.  Being restless and weak.  Feeling hopeless, worthless, or guilty.  Trouble thinking  clearly or making decisions.  Thoughts of hurting yourself or others, or thoughts of ending your life.  Spending a lot of time alone.  Inability to complete common tasks of daily life. If you have very bad MDD, you may:  Believe things that are not true.  Hear, see, taste, or feel things that are not there.  Have mild depression that lasts for at least 2 years.  Feel very sad and hopeless.  Have trouble speaking or moving. How is this treated? This condition may be treated with:  Talk therapy. This teaches you to know bad thoughts, feelings, and actions and how to change them. ? This can also help you to communicate with others. ? This can be done with members of your family.  Medicines. These can be used to treat worry (anxiety), depression, or low levels of chemicals in the brain.  Lifestyle changes. You may need to: ? Limit alcohol use. ? Limit drug use. ? Get regular exercise. ? Get plenty of sleep. ? Make healthy eating choices. ? Spend more time outdoors.  Brain stimulation. This treatment excites the brain. This is done when symptoms are very bad or have not gotten better with other treatments. Follow  these instructions at home: Activity  Get regular exercise as told.  Spend time outdoors as told.  Make time to do the things you enjoy.  Find ways to deal with stress. Try to: ? Meditate. ? Do deep breathing. ? Spend time in nature. ? Keep a journal.  Return to your normal activities as told by your doctor. Ask your doctor what activities are safe for you. Alcohol and drug use  If you drink alcohol: ? Limit how much you use to:  0-1 drink a day for women.  0-2 drinks a day for men. ? Be aware of how much alcohol is in your drink. In the [place] one drink equals one 12 oz bottle of beer (355 mL), one 5 oz glass of wine (148 mL), or one 1 oz glass of hard liquor (44 mL).  Talk to your doctor about: ? Alcohol use. Alcohol can affect some medicines. ? Any drug use. General instructions  Take over-the-counter and prescription medicines and herbal preparations only as told by your doctor.  Eat a healthy diet.  Get a lot of sleep.  Think about joining a support group. Your doctor may be able to suggest one.  Keep all follow-up visits as told by your doctor. This is important.   Where to find more information:  The [employer] on Mental Illness: www.nami.org  [place] [employer] of Mental Health: http://www.maynard.net/www.nimh.nih.gov  American Psychiatric Association: www.psychiatry.org/patients-families/ Contact a doctor if:  Your symptoms get worse.  You get new symptoms. Get help right away if:  You hurt yourself.  You have serious thoughts about hurting yourself or others.  You see, hear, taste, smell, or feel things that are not there. If you ever feel like you may hurt yourself or others, or have thoughts about taking your own life, get help right away. Go to your nearest emergency department or:  Call your local emergency services (911 in the [place]  Call a suicide crisis helpline, such as the National Suicide Prevention Lifeline at 972-748-56471-8192812154. This is open 24 hours  a day in the [place]  Text the Crisis Text Line at 929-187-0258741741 (in the [place] Summary  Major depressive disorder is a mental health condition. This disorder affects feelings. Symptoms of this condition last most of the day, almost  every day, for 2 weeks.  The symptoms of this disorder can cause problems with relationships and with daily activities.  There are treatments and support for people who get this disorder. You may need more than one type of treatment.  Get help right away if you have serious thoughts about hurting yourself or others. This information is not intended to replace advice given to you by your health care provider. Make sure you discuss any questions you have with your health care provider. Document Revised: 11/23/2019 Document Reviewed: 11/23/2019 Elsevier Patient Education  2021 [employer].

## 2021-05-26 ENCOUNTER — Ambulatory Visit: Payer: Medicare Other | Admitting: Nurse Practitioner

## 2021-05-27 ENCOUNTER — Ambulatory Visit: Payer: Medicare Other

## 2021-05-27 ENCOUNTER — Other Ambulatory Visit: Payer: Self-pay

## 2021-05-27 LAB — TB SKIN TEST
Induration: 0 mm
TB Skin Test: NEGATIVE

## 2021-05-30 DIAGNOSIS — F321 Major depressive disorder, single episode, moderate: Secondary | ICD-10-CM | POA: Insufficient documentation

## 2021-05-30 DIAGNOSIS — Z111 Encounter for screening for respiratory tuberculosis: Secondary | ICD-10-CM | POA: Insufficient documentation

## 2021-05-31 NOTE — Progress Notes (Signed)
Negative TB skin test.

## 2021-06-07 ENCOUNTER — Telehealth: Payer: Self-pay | Admitting: Nurse Practitioner

## 2021-06-07 NOTE — Telephone Encounter (Signed)
Patient's daughter needs a capability letter stating that she can make decisions for her dad since he has dementia. Please advise, thanks.

## 2021-06-09 ENCOUNTER — Other Ambulatory Visit: Payer: Self-pay | Admitting: Nurse Practitioner

## 2021-06-16 ENCOUNTER — Encounter: Payer: Self-pay | Admitting: Nurse Practitioner

## 2021-06-21 ENCOUNTER — Telehealth: Payer: Self-pay | Admitting: Nurse Practitioner

## 2021-06-21 NOTE — Telephone Encounter (Signed)
Patient picked up letter from PCP

## 2021-06-21 NOTE — Telephone Encounter (Signed)
Patient's daughter is returning call about disability concerning patient. She said she spoke with you, thanks.

## 2021-08-25 ENCOUNTER — Ambulatory Visit: Payer: Medicare Other | Admitting: Nurse Practitioner

## 2021-10-31 ENCOUNTER — Inpatient Hospital Stay (HOSPITAL_COMMUNITY): Payer: Medicare Other

## 2021-10-31 ENCOUNTER — Other Ambulatory Visit: Payer: Self-pay

## 2021-10-31 ENCOUNTER — Encounter (HOSPITAL_COMMUNITY): Payer: Self-pay | Admitting: Emergency Medicine

## 2021-10-31 ENCOUNTER — Emergency Department (HOSPITAL_COMMUNITY): Payer: Medicare Other

## 2021-10-31 ENCOUNTER — Inpatient Hospital Stay (HOSPITAL_COMMUNITY)
Admission: EM | Admit: 2021-10-31 | Discharge: 2021-11-06 | DRG: 871 | Disposition: A | Discharge status: Skilled Nursing Facility | Payer: Medicare Other | Attending: Internal Medicine | Admitting: Internal Medicine

## 2021-10-31 DIAGNOSIS — J189 Pneumonia, unspecified organism: Secondary | ICD-10-CM

## 2021-10-31 DIAGNOSIS — R6520 Severe sepsis without septic shock: Secondary | ICD-10-CM | POA: Insufficient documentation

## 2021-10-31 DIAGNOSIS — Z515 Encounter for palliative care: Secondary | ICD-10-CM | POA: Diagnosis not present

## 2021-10-31 DIAGNOSIS — A419 Sepsis, unspecified organism: Secondary | ICD-10-CM | POA: Diagnosis present

## 2021-10-31 DIAGNOSIS — E162 Hypoglycemia, unspecified: Secondary | ICD-10-CM

## 2021-10-31 DIAGNOSIS — E11649 Type 2 diabetes mellitus with hypoglycemia without coma: Secondary | ICD-10-CM | POA: Diagnosis not present

## 2021-10-31 DIAGNOSIS — E43 Unspecified severe protein-calorie malnutrition: Secondary | ICD-10-CM | POA: Insufficient documentation

## 2021-10-31 DIAGNOSIS — I129 Hypertensive chronic kidney disease with stage 1 through stage 4 chronic kidney disease, or unspecified chronic kidney disease: Secondary | ICD-10-CM | POA: Diagnosis present

## 2021-10-31 DIAGNOSIS — G9389 Other specified disorders of brain: Secondary | ICD-10-CM | POA: Diagnosis present

## 2021-10-31 DIAGNOSIS — J9601 Acute respiratory failure with hypoxia: Secondary | ICD-10-CM | POA: Diagnosis present

## 2021-10-31 DIAGNOSIS — J9691 Respiratory failure, unspecified with hypoxia: Secondary | ICD-10-CM | POA: Diagnosis present

## 2021-10-31 DIAGNOSIS — J690 Pneumonitis due to inhalation of food and vomit: Secondary | ICD-10-CM | POA: Diagnosis present

## 2021-10-31 DIAGNOSIS — F02818 Dementia in other diseases classified elsewhere, unspecified severity, with other behavioral disturbance: Secondary | ICD-10-CM | POA: Diagnosis not present

## 2021-10-31 DIAGNOSIS — R64 Cachexia: Secondary | ICD-10-CM | POA: Diagnosis present

## 2021-10-31 DIAGNOSIS — E039 Hypothyroidism, unspecified: Secondary | ICD-10-CM | POA: Diagnosis present

## 2021-10-31 DIAGNOSIS — E785 Hyperlipidemia, unspecified: Secondary | ICD-10-CM | POA: Diagnosis present

## 2021-10-31 DIAGNOSIS — Z833 Family history of diabetes mellitus: Secondary | ICD-10-CM | POA: Diagnosis not present

## 2021-10-31 DIAGNOSIS — N182 Chronic kidney disease, stage 2 (mild): Secondary | ICD-10-CM | POA: Diagnosis present

## 2021-10-31 DIAGNOSIS — G9341 Metabolic encephalopathy: Secondary | ICD-10-CM | POA: Diagnosis present

## 2021-10-31 DIAGNOSIS — Z20822 Contact with and (suspected) exposure to covid-19: Secondary | ICD-10-CM | POA: Diagnosis present

## 2021-10-31 DIAGNOSIS — E1122 Type 2 diabetes mellitus with diabetic chronic kidney disease: Secondary | ICD-10-CM | POA: Diagnosis present

## 2021-10-31 DIAGNOSIS — E876 Hypokalemia: Secondary | ICD-10-CM | POA: Diagnosis not present

## 2021-10-31 DIAGNOSIS — Z66 Do not resuscitate: Secondary | ICD-10-CM | POA: Diagnosis present

## 2021-10-31 DIAGNOSIS — E1165 Type 2 diabetes mellitus with hyperglycemia: Secondary | ICD-10-CM | POA: Diagnosis not present

## 2021-10-31 DIAGNOSIS — F03C18 Unspecified dementia, severe, with other behavioral disturbance: Secondary | ICD-10-CM | POA: Diagnosis present

## 2021-10-31 DIAGNOSIS — Z681 Body mass index (BMI) 19 or less, adult: Secondary | ICD-10-CM

## 2021-10-31 DIAGNOSIS — I69320 Aphasia following cerebral infarction: Secondary | ICD-10-CM

## 2021-10-31 DIAGNOSIS — Z9911 Dependence on respirator [ventilator] status: Secondary | ICD-10-CM | POA: Diagnosis not present

## 2021-10-31 LAB — TROPONIN I (HIGH SENSITIVITY)
Troponin I (High Sensitivity): 9 ng/L (ref <18)
Troponin I (High Sensitivity): 31 ng/L — ABNORMAL HIGH (ref <18)

## 2021-10-31 LAB — MAGNESIUM
Magnesium: 2.2 mg/dL (ref 1.7–2.4)
Magnesium: 2.1 mg/dL (ref 1.7–2.4)

## 2021-10-31 LAB — GLUCOSE, CAPILLARY
Glucose-Capillary: 105 mg/dL — ABNORMAL HIGH (ref 70–99)
Glucose-Capillary: 133 mg/dL — ABNORMAL HIGH (ref 70–99)
Glucose-Capillary: 142 mg/dL — ABNORMAL HIGH (ref 70–99)
Glucose-Capillary: 173 mg/dL — ABNORMAL HIGH (ref 70–99)
Glucose-Capillary: 237 mg/dL — ABNORMAL HIGH (ref 70–99)
Glucose-Capillary: 53 mg/dL — ABNORMAL LOW (ref 70–99)
Glucose-Capillary: 63 mg/dL — ABNORMAL LOW (ref 70–99)

## 2021-10-31 LAB — LACTIC ACID, PLASMA
Lactic Acid, Venous: 7.1 mmol/L (ref 0.5–1.9)
Lactic Acid, Venous: 6.9 mmol/L (ref 0.5–1.9)
Lactic Acid, Venous: 6 mmol/L (ref 0.5–1.9)
Lactic Acid, Venous: 4.4 mmol/L (ref 0.5–1.9)

## 2021-10-31 LAB — COMPREHENSIVE METABOLIC PANEL
ALT: 52 U/L — ABNORMAL HIGH (ref 0–44)
AST: 36 U/L (ref 15–41)
Albumin: 3.3 g/dL — ABNORMAL LOW (ref 3.5–5.0)
Alkaline Phosphatase: 121 U/L (ref 38–126)
Anion gap: 16 — ABNORMAL HIGH (ref 5–15)
BUN: 33 mg/dL — ABNORMAL HIGH (ref 8–23)
CO2: 23 mmol/L (ref 22–32)
Calcium: 8.9 mg/dL (ref 8.9–10.3)
Chloride: 95 mmol/L — ABNORMAL LOW (ref 98–111)
Creatinine, Ser: 1.26 mg/dL — ABNORMAL HIGH (ref 0.61–1.24)
GFR, Estimated: >60 mL/min (ref >60)
Glucose, Bld: 358 mg/dL — ABNORMAL HIGH (ref 70–99)
Potassium: 4.1 mmol/L (ref 3.5–5.1)
Sodium: 134 mmol/L — ABNORMAL LOW (ref 135–145)
Total Bilirubin: 0.8 mg/dL (ref 0.3–1.2)
Total Protein: 7.1 g/dL (ref 6.5–8.1)

## 2021-10-31 LAB — RESP PANEL BY RT-PCR (FLU A&B, COVID) ARPGX2
Influenza A by PCR: NEGATIVE
Influenza B by PCR: NEGATIVE
SARS Coronavirus 2 by RT PCR: NEGATIVE

## 2021-10-31 LAB — URINALYSIS, ROUTINE W REFLEX MICROSCOPIC
Bacteria, UA: NONE SEEN
Bilirubin Urine: NEGATIVE
Glucose, UA: 500 mg/dL — AB
Ketones, ur: NEGATIVE mg/dL
Leukocytes,Ua: NEGATIVE
Nitrite: NEGATIVE
Protein, ur: >=300 mg/dL — AB
Specific Gravity, Urine: 1.025 (ref 1.005–1.030)
pH: 6.5 (ref 5.0–8.0)

## 2021-10-31 LAB — BLOOD GAS, ARTERIAL
Acid-base deficit: 3.6 mmol/L — ABNORMAL HIGH (ref 0.0–2.0)
Bicarbonate: 22.3 mmol/L (ref 20.0–28.0)
O2 Saturation: 99.8 %
Patient temperature: 98.6
pCO2 arterial: 46.2 mmHg (ref 32.0–48.0)
pH, Arterial: 7.304 — ABNORMAL LOW (ref 7.350–7.450)
pO2, Arterial: 306 mmHg — ABNORMAL HIGH (ref 83.0–108.0)

## 2021-10-31 LAB — PHOSPHORUS
Phosphorus: 2.9 mg/dL (ref 2.5–4.6)
Phosphorus: 2.7 mg/dL (ref 2.5–4.6)

## 2021-10-31 LAB — CBC WITH DIFFERENTIAL/PLATELET
Band Neutrophils: 9 %
Basophils Relative: 0 %
Blasts: NONE SEEN %
Eosinophils Relative: 0 %
HCT: 39.4 % (ref 39.0–52.0)
Hemoglobin: 13.8 g/dL (ref 13.0–17.0)
Lymphocytes Relative: 17 %
MCH: 30.9 pg (ref 26.0–34.0)
MCHC: 35 g/dL (ref 30.0–36.0)
MCV: 88.1 fL (ref 80.0–100.0)
Metamyelocytes Relative: NONE SEEN %
Monocytes Relative: 3 %
Myelocytes: NONE SEEN %
Neutrophils Relative %: 71 %
Platelets: 334 10*3/uL (ref 150–400)
Promyelocytes Relative: NONE SEEN %
RBC: 4.47 MIL/uL (ref 4.22–5.81)
RDW: 12.4 % (ref 11.5–15.5)
WBC Morphology: NORMAL
WBC: 10.3 10*3/uL (ref 4.0–10.5)
nRBC: 0 % (ref 0.0–0.2)
nRBC: NONE SEEN /100 WBC

## 2021-10-31 LAB — T4, FREE: Free T4: 0.68 ng/dL (ref 0.61–1.12)

## 2021-10-31 LAB — PROCALCITONIN: Procalcitonin: 6.38 ng/mL

## 2021-10-31 LAB — APTT: aPTT: 31 seconds (ref 24–36)

## 2021-10-31 LAB — PROTIME-INR
INR: 1 (ref 0.8–1.2)
Prothrombin Time: 13.5 seconds (ref 11.4–15.2)

## 2021-10-31 LAB — CBG MONITORING, ED
Glucose-Capillary: 336 mg/dL — ABNORMAL HIGH (ref 70–99)
Glucose-Capillary: 273 mg/dL — ABNORMAL HIGH (ref 70–99)

## 2021-10-31 LAB — TSH: TSH: 66.314 u[IU]/mL — ABNORMAL HIGH (ref 0.350–4.500)

## 2021-10-31 LAB — MRSA NEXT GEN BY PCR, NASAL: MRSA by PCR Next Gen: NOT DETECTED

## 2021-10-31 LAB — STREP PNEUMONIAE URINARY ANTIGEN: Strep Pneumo Urinary Antigen: NEGATIVE

## 2021-10-31 LAB — HEMOGLOBIN A1C
Hgb A1c MFr Bld: 9.2 % — ABNORMAL HIGH (ref 4.8–5.6)
Mean Plasma Glucose: 217.34 mg/dL

## 2021-10-31 LAB — BETA-HYDROXYBUTYRIC ACID: Beta-Hydroxybutyric Acid: 0.06 mmol/L (ref 0.05–0.27)

## 2021-10-31 LAB — AMMONIA: Ammonia: 29 umol/L (ref 9–35)

## 2021-10-31 LAB — CK: Total CK: 230 U/L (ref 49–397)

## 2021-10-31 MED ORDER — LACTATED RINGERS IV BOLUS
500.0000 mL | Freq: Once | INTRAVENOUS | Status: AC
  Administered 2021-10-31: 500 mL via INTRAVENOUS

## 2021-10-31 MED ORDER — LEVOTHYROXINE SODIUM 50 MCG PO TABS
50.0000 ug | ORAL_TABLET | Freq: Every day | ORAL | Status: DC
  Administered 2021-10-31 – 2021-11-01 (×2): 50 ug
  Filled 2021-10-31 (×2): qty 1

## 2021-10-31 MED ORDER — DOCUSATE SODIUM 50 MG/5ML PO LIQD
100.0000 mg | Freq: Two times a day (BID) | ORAL | Status: DC | PRN
  Filled 2021-10-31: qty 10

## 2021-10-31 MED ORDER — SODIUM CHLORIDE 0.9 % IV SOLN
INTRAVENOUS | Status: DC | PRN
  Administered 2021-11-01: 10 mL via INTRAVENOUS

## 2021-10-31 MED ORDER — ORAL CARE MOUTH RINSE: 15.0000 mL | OROMUCOSAL | Status: DC

## 2021-10-31 MED ORDER — PROSOURCE TF PO LIQD
45.0000 mL | Freq: Two times a day (BID) | ORAL | Status: DC
  Administered 2021-10-31 – 2021-11-01 (×3): 45 mL
  Filled 2021-10-31 (×3): qty 45

## 2021-10-31 MED ORDER — PROPOFOL 1000 MG/100ML IV EMUL
5.0000 ug/kg/min | INTRAVENOUS | Status: DC
  Administered 2021-10-31 (×2): 5 ug/kg/min via INTRAVENOUS
  Filled 2021-10-31: qty 100

## 2021-10-31 MED ORDER — ETOMIDATE 2 MG/ML IV SOLN
10.0000 mg | Freq: Once | INTRAVENOUS | Status: AC
  Administered 2021-10-31: 10 mg via INTRAVENOUS

## 2021-10-31 MED ORDER — HEPARIN SODIUM (PORCINE) 5000 UNIT/ML IJ SOLN
5000.0000 [IU] | Freq: Three times a day (TID) | INTRAMUSCULAR | Status: DC
  Administered 2021-10-31 – 2021-11-02 (×7): 5000 [IU] via SUBCUTANEOUS
  Filled 2021-10-31 (×4): qty 1

## 2021-10-31 MED ORDER — INSULIN ASPART 100 UNIT/ML IJ SOLN
0.0000 [IU] | INTRAMUSCULAR | Status: DC
  Administered 2021-10-31: 2 [IU] via SUBCUTANEOUS
  Administered 2021-10-31: 11 [IU] via SUBCUTANEOUS
  Administered 2021-10-31: 5 [IU] via SUBCUTANEOUS
  Filled 2021-10-31: qty 0.15

## 2021-10-31 MED ORDER — ORAL CARE MOUTH RINSE
15.0000 mL | OROMUCOSAL | Status: DC
  Administered 2021-10-31 – 2021-11-01 (×11): 15 mL via OROMUCOSAL

## 2021-10-31 MED ORDER — SODIUM CHLORIDE 0.9 % IV BOLUS
1000.0000 mL | Freq: Once | INTRAVENOUS | Status: AC
  Administered 2021-10-31: 1000 mL via INTRAVENOUS

## 2021-10-31 MED ORDER — SODIUM CHLORIDE 0.9 % IV SOLN
500.0000 mg | INTRAVENOUS | Status: AC
  Administered 2021-10-31 – 2021-11-02 (×3): 500 mg via INTRAVENOUS
  Filled 2021-10-31 (×3): qty 500

## 2021-10-31 MED ORDER — CHLORHEXIDINE GLUCONATE 0.12% ORAL RINSE (MEDLINE KIT)
15.0000 mL | Freq: Two times a day (BID) | OROMUCOSAL | Status: DC
  Administered 2021-10-31 – 2021-11-01 (×3): 15 mL via OROMUCOSAL

## 2021-10-31 MED ORDER — DOCUSATE SODIUM 100 MG PO CAPS: 100.0000 mg | ORAL_CAPSULE | Freq: Two times a day (BID) | ORAL | Status: DC | PRN

## 2021-10-31 MED ORDER — MAGNESIUM SULFATE 2 GM/50ML IV SOLN
2.0000 g | Freq: Once | INTRAVENOUS | Status: AC
  Administered 2021-10-31: 2 g via INTRAVENOUS
  Filled 2021-10-31: qty 50

## 2021-10-31 MED ORDER — ROCURONIUM BROMIDE 10 MG/ML (PF) SYRINGE
80.0000 mg | PREFILLED_SYRINGE | Freq: Once | INTRAVENOUS | Status: AC
  Administered 2021-10-31: 80 mg via INTRAVENOUS

## 2021-10-31 MED ORDER — SODIUM CHLORIDE 0.9 % IV SOLN
1.0000 g | INTRAVENOUS | Status: DC
  Administered 2021-10-31 – 2021-11-01 (×2): 1 g via INTRAVENOUS
  Filled 2021-10-31 (×3): qty 10

## 2021-10-31 MED ORDER — CHLORHEXIDINE GLUCONATE CLOTH 2 % EX PADS
6.0000 | MEDICATED_PAD | Freq: Every day | CUTANEOUS | Status: DC
  Administered 2021-10-31 – 2021-11-05 (×7): 6 via TOPICAL

## 2021-10-31 MED ORDER — FAMOTIDINE IN NACL 20-0.9 MG/50ML-% IV SOLN
20.0000 mg | Freq: Two times a day (BID) | INTRAVENOUS | Status: DC
  Administered 2021-10-31 – 2021-11-01 (×3): 20 mg via INTRAVENOUS
  Filled 2021-10-31 (×4): qty 50

## 2021-10-31 MED ORDER — DEXTROSE 50 % IV SOLN
INTRAVENOUS | Status: AC
  Administered 2021-10-31: 50 mL
  Filled 2021-10-31: qty 50

## 2021-10-31 MED ORDER — LACTATED RINGERS IV SOLN: INTRAVENOUS | Status: DC

## 2021-10-31 MED ORDER — POLYETHYLENE GLYCOL 3350 17 G PO PACK: 17.0000 g | PACK | Freq: Every day | ORAL | Status: DC | PRN

## 2021-10-31 MED ORDER — INSULIN DETEMIR 100 UNIT/ML ~~LOC~~ SOLN
6.0000 [IU] | Freq: Two times a day (BID) | SUBCUTANEOUS | Status: DC
  Administered 2021-10-31 – 2021-11-01 (×3): 6 [IU] via SUBCUTANEOUS
  Filled 2021-10-31 (×3): qty 0.06

## 2021-10-31 MED ORDER — VITAL HIGH PROTEIN PO LIQD
1000.0000 mL | ORAL | Status: DC
  Administered 2021-10-31: 1000 mL

## 2021-10-31 MED ORDER — CHLORHEXIDINE GLUCONATE 0.12% ORAL RINSE (MEDLINE KIT): 15.0000 mL | Freq: Two times a day (BID) | OROMUCOSAL | Status: DC

## 2021-10-31 MED ORDER — SODIUM CHLORIDE 0.9 % IV SOLN
3.0000 g | Freq: Once | INTRAVENOUS | Status: AC
  Administered 2021-10-31: 3 g via INTRAVENOUS
  Filled 2021-10-31: qty 8

## 2021-10-31 MED ORDER — LACTATED RINGERS IV BOLUS
1000.0000 mL | Freq: Once | INTRAVENOUS | Status: AC
  Administered 2021-10-31: 1000 mL via INTRAVENOUS

## 2021-10-31 MED ORDER — THIAMINE HCL 100 MG/ML IJ SOLN
100.0000 mg | Freq: Every day | INTRAMUSCULAR | Status: DC
  Administered 2021-10-31 – 2021-11-03 (×4): 100 mg via INTRAVENOUS
  Filled 2021-10-31 (×4): qty 2

## 2021-10-31 NOTE — ED Notes (Signed)
Increased propofol drip to 49mcg/min HR 147 97.7 F 100% O2 15 RR 201/103 (129)

## 2021-10-31 NOTE — H&P (Addendum)
NAME:  Victor Copeland, MRN:  [id number], DOB:  Jul 16, 1956, LOS: 0 ADMISSION DATE:  10/31/2021, CONSULTATION DATE:  10/31/2021 REFERRING MD:  Dr. Kennis Carina REASON FOR CONSULTATION: Respiratory failure  History of Present Illness:  42M with PMHx significant for CVA with right sided deficits and reported aphasia, rapidly progressive dementia with behavioral disturbances, and T2DM who presents from home with chief concern on the part of the family of nausea and vomiting.   On arrival by EMS was found to be hypoxic and started on NRB before presenting to [facility]. In the ED was found to still be hypoxic in the low 80s and in respiratory distress and was intubated emergently. No family at bedside to provide additional history at the time of my evaluation.  Pertinent  Medical History  1) CVA with right-sided deficits 2) Dementia with behavioral disturbances 3) Hypothyroidism 4) Type 2 Diabetes Mellitus 5) Hypertension 6) CKD  Significant Hospital Events: Including procedures, antibiotic start and stop dates in addition to other pertinent events   Intubated in the emergency department for hypoxia and admitted to ICU   Objective   Blood pressure 107/71, pulse (!) 130, temperature (!) 97.1 F (36.2 C), resp. rate (!) 27, height 5' (1.524 m), weight 45.5 kg, SpO2 100 %.    Vent Mode: PRVC FiO2 (%):  [60 %-100 %] 60 % Set Rate:  [15 bmp] 15 bmp Vt Set:  [400 mL] 400 mL PEEP:  [5 cmH20] 5 cmH20 Plateau Pressure:  [20 cmH20] 20 cmH20  No intake or output data in the 24 hours ending 10/31/21 0303 Filed Weights   10/31/21 0214  Weight: 45.5 kg    Examination: General: intubated and sedated. No obvious distress HENT: anicteric sclera. Left pupil slightly larger than right, both reactive. Dry oral mucosa Lungs: symmetric chest expansion. Rhonchorous on right. No wheeze.  Cardiovascular: tachycardic, regular rhythm. No murmur. Extremities warm. Cap refill < 2seconds. No edema Abdomen:  nondistended Extremities: no cyanosis. No clubbing Neuro: sedated Integument: no rash on exposed skin  Assessment & Plan:   #Acute Hypoxic Respiratory Failure --likely secondary [id number] pneumonitis vs. PNA  given reported clinical history of nausea/vomiting. No family at bedside to corroborate. Run of the mill CAP also considered. COVID/FLU negative. CXR with right basilar infiltrate --On admission required Mechanical ventilation on PRVC FiO2 60%, PEEP 5, RR15, TV 400 --Diagnostic plan: procalcitonin, sputum culture/resp quant, Urine Legionella Ag, and Urine Pneumococcal Ag --Therapeutic plan: Ceftriaxone + Azithromycin for now   #Sepsis Secondary to a Pulmonary Source --Patient presents with  hypothermia, tachycardia and elevated serum lactate   now status post  2L isotonic fluid resuscitation and empiric ampicillin/sulbactam  in the emergency department --Differential Diagnosis for source: pulmonary given CXR and hypoxia. Urinalysis unremarkable. --Diagnostic Plan:  Blood cultures, Sputum Culture, and procalcitonin --Therapeutic Plan: Ceftriaxone and Azithromycin Adjunctive therapies: additional bolus of 500cc LR given volume status appears hypovolemic by exam  #Sedation plan for a mechanically ventilated patient --RASS goal: 0 to -1, patient  is oversedated  at present --Medications ordered: propofol gtt, could reasonably start weaning now  #Diabetes mellitus with hyperglycemia --BG ~350. Goal < 180. --Start 6U levamir BID with SSI q4hrs. Titrate to goal --check HgbA1C --hold home oral meds  #Lactic Acidosis --LA > 7. likely 2/2 profound hypoxia on arrival. Recheck following intubation.  #History of hypothyroidism --continue levothyroxine. Check TSH.  Best Practice (right click and "Reselect all SmartList Selections" daily)   Diet/type: NPO w/ meds via tube DVT prophylaxis: prophylactic  heparin  GI prophylaxis: H2B Lines: N/A Foley:  Yes, and it is still  needed Code Status:  full code Last date of multidisciplinary goals of care discussion [NA]  Labs   CBC: Recent Labs  Lab 10/31/21 0200  WBC 10.3  NEUTROABS PENDING  HGB 13.8  HCT 39.4  MCV 88.1  PLT 334    Basic Metabolic Panel: Recent Labs  Lab 10/31/21 0200  NA 134*  K 4.1  CL 95*  CO2 23  GLUCOSE 358*  BUN 33*  CREATININE 1.26*  CALCIUM 8.9   GFR: Estimated Creatinine Clearance: 37.1 mL/min (A) (by C-G formula based on SCr of 1.26 mg/dL (H)). Recent Labs  Lab 10/31/21 0200  WBC 10.3  LATICACIDVEN 7.1*    Liver Function Tests: Recent Labs  Lab 10/31/21 0200  AST 36  ALT 52*  ALKPHOS 121  BILITOT 0.8  PROT 7.1  ALBUMIN 3.3*   No results for input(s): LIPASE, AMYLASE in the last 168 hours. No results for input(s): AMMONIA in the last 168 hours.  ABG    Component Value Date/Time   PHART 7.304 (L) 10/31/2021 0225   PCO2ART 46.2 10/31/2021 0225   PO2ART 306 (H) 10/31/2021 0225   HCO3 22.3 10/31/2021 0225   ACIDBASEDEF 3.6 (H) 10/31/2021 0225   O2SAT 99.8 10/31/2021 0225     Coagulation Profile: Recent Labs  Lab 10/31/21 0210  INR 1.0    Cardiac Enzymes: No results for input(s): CKTOTAL, CKMB, CKMBINDEX, TROPONINI in the last 168 hours.  HbA1C: Hemoglobin A1C  Date/Time Value Ref Range Status  05/25/2021 01:50 PM 8.5 (A) 4.0 - 5.6 % Final  02/22/2021 09:45 AM 11.8 (A) 4.0 - 5.6 % Final    CBG: No results for input(s): GLUCAP in the last 168 hours.  Imaging:  CXR (10/31/2021) Right basilar infiltrate. ETT appropriately positioned  Review of Systems:   Unable to obtain 2/2 patient condition  Past Medical History:  He,  has a past medical history of DM (diabetes mellitus) (HCC), HLD (hyperlipidemia), HTN (hypertension), Hypothyroid, and Stroke (HCC).   Surgical History:  History reviewed. No pertinent surgical history.   Social History:   reports that he has never smoked. He has never used smokeless tobacco. He reports that he  does not currently use alcohol. He reports that he does not currently use drugs.   Family History:  His family history includes Diabetes in his mother.   Allergies No Known Allergies   Home Medications  Prior to Admission medications   Medication Sig Start Date End Date Taking? Authorizing Provider  atorvastatin (LIPITOR) 40 MG tablet TAKE 1 TABLET BY MOUTH DAILY Patient taking differently: Take 40 mg by mouth every other day. 06/09/21  Yes Boscia, Kathlynn Grate, NP  empagliflozin (JARDIANCE) 25 MG TABS tablet Take 1 tablet (25 mg total) by mouth daily before breakfast. Patient taking differently: Take 25 mg by mouth every other day. 05/25/21  Yes Carlean Jews, NP  levothyroxine (SYNTHROID) 50 MCG tablet Take 1 tablet (50 mcg total) by mouth daily before breakfast. Patient taking differently: Take 50 mcg by mouth every other day. 02/22/21  Yes Boscia, Heather E, NP  losartan (COZAAR) 50 MG tablet TAKE 1 TABLET BY MOUTH DAILY Patient taking differently: Take 50 mg by mouth every other day. 06/09/21  Yes Boscia, Kathlynn Grate, NP  metFORMIN (GLUCOPHAGE) 1000 MG tablet Take 1 tablet (1,000 mg total) by mouth 2 (two) times daily with a meal. Patient taking differently: Take 1,000 mg by mouth  every other day. 02/22/21  Yes Boscia, Kathlynn Grate, NP  metoprolol tartrate (LOPRESSOR) 25 MG tablet TAKE 1 TABLET BY MOUTH DAILY Patient taking differently: Take 25 mg by mouth every other day. 06/09/21  Yes Boscia, Kathlynn Grate, NP  mirtazapine (REMERON) 7.5 MG tablet Take 1 tablet po QPM with supper Patient taking differently: Take 7.5 mg by mouth every other day. 05/25/21  Yes Carlean Jews, NP  QUEtiapine (SEROQUEL) 25 MG tablet Take 1/2 to 1 tablet at bedtime as needed Patient not taking: Reported on 10/31/2021 03/17/21   Carlean Jews, NP     Critical care time: 34 minutes

## 2021-10-31 NOTE — ED Notes (Signed)
Patient BIB [facility] from home. Full code. Nausea and vomiting since 4pm yesterday. History of stroke in the past. Contracted. Nonverbal. EMS gave nebulizer and solumedrol. Patient was 60% room air on arrival. 80s with 25L NRB. Code sepsis called. Capnography less than 20. CBG 368. Known diabetic with type 2 diabetes, HTN, dementia.

## 2021-10-31 NOTE — ED Notes (Signed)
TIME OUT for sedation due to sepsis and respiratory failure.  HR 130 83% with ambu bag 120/72 32 RR

## 2021-10-31 NOTE — ED Notes (Signed)
Increased propofol at 85mcg/min. HR 147 192/102 (126) BP 100% on ventilator 97.24F temp foley

## 2021-10-31 NOTE — ED Provider Notes (Signed)
[facility] DEPT [facility] Emergency Department Provider Note MRN:  [id number]  Arrival date & time: 10/31/21     Chief Complaint   Nausea vomiting  History of Present Illness   Victor Copeland is a 65 y.o. year-old male with a history of stroke presenting to the ED with chief complaint of nausea vomiting.  EMS called for nausea and vomiting.  Found to be hypoxic in the 60s on room air, started on nonrebreather.  Reportedly at his baseline mental status and neurological exam, residual deficits from prior stroke including aphasia.  I was unable to obtain an accurate HPI, PMH, or ROS due to the patient's respiratory failure.  Level 5 caveat.  Review of Systems  A complete 10 system review of systems was obtained and all systems are negative except as noted in the HPI and PMH.  Positive for respiratory failure, nausea vomiting.  Patient's Health History    Past Medical History:  Diagnosis Date   DM (diabetes mellitus) (HCC)    HLD (hyperlipidemia)    HTN (hypertension)    Hypothyroid    Stroke [facility])     History reviewed. No pertinent surgical history.  Family History  Problem Relation Age of Onset   Diabetes Mother     Social History   Socioeconomic History   Marital status: Married    Spouse name: Billey Chang   Number of children: 3   Years of education: Not on file   Highest education level: Not on file  Occupational History   Occupation: disabled  Tobacco Use   Smoking status: Never   Smokeless tobacco: Never  Substance and Sexual Activity   Alcohol use: Not Currently   Drug use: Not Currently   Sexual activity: Not Currently  Other Topics Concern   Not on file  Social History Narrative   Not on file   Social Determinants of Health   Financial Resource Strain: Not on file  Food Insecurity: Not on file  Transportation Needs: Not on file  Physical Activity: Not on file  Stress: Not on file  Social Connections: Not on file  Intimate Partner Violence: Not  on file     Physical Exam   Vitals:   10/31/21 0231 10/31/21 0245  BP: (!) 201/103 129/76  Pulse: (!) 147 (!) 134  Resp: 15 15  Temp:  (!) 97.2 F (36.2 C)  SpO2: 100% 100%    CONSTITUTIONAL: Ill-appearing, in severe respiratory distress NEURO: Eyes open but not responding to stimuli EYES:  eyes equal and reactive ENT/NECK:  no LAD, no JVD CARDIO: Tachycardic rate, well-perfused, normal S1 and S2 PULM: Diffuse rhonchi GI/GU:  normal bowel sounds, non-distended, non-tender MSK/SPINE:  No gross deformities, no edema SKIN:  no rash, atraumatic PSYCH:  Appropriate speech and behavior  *Additional and/or pertinent findings included in MDM below  Diagnostic and Interventional Summary    EKG Interpretation  Date/Time:  Sunday October 31 2021 01:42:35 EST Ventricular Rate:  149 PR Interval:  105 QRS Duration: 93 QT Interval:  353 QTC Calculation: 556 R Axis:   74 Text Interpretation: Sinus tachycardia Consider left ventricular hypertrophy Nonspecific T abnormalities, lateral leads Prolonged QT interval Confirmed by Kennis Carina [phone]) on 10/31/2021 2:17:41 AM       Labs Reviewed  COMPREHENSIVE METABOLIC PANEL - Abnormal; Notable for the following components:      Result Value   Sodium 134 (*)    Chloride 95 (*)    Glucose, Bld 358 (*)    BUN 33 (*)  Creatinine, Ser 1.26 (*)    Albumin 3.3 (*)    ALT 52 (*)    Anion gap 16 (*)    All other components within normal limits  LACTIC ACID, PLASMA - Abnormal; Notable for the following components:   Lactic Acid, Venous 7.1 (*)    All other components within normal limits  URINALYSIS, ROUTINE W REFLEX MICROSCOPIC - Abnormal; Notable for the following components:   Color, Urine YELLOW (*)    APPearance CLEAR (*)    Glucose, UA 500 (*)    Hgb urine dipstick TRACE (*)    Protein, ur >=300 (*)    All other components within normal limits  BLOOD GAS, ARTERIAL - Abnormal; Notable for the following components:   pH,  Arterial 7.304 (*)    pO2, Arterial 306 (*)    Acid-base deficit 3.6 (*)    All other components within normal limits  CULTURE, BLOOD (ROUTINE X 2)  CULTURE, BLOOD (ROUTINE X 2)  URINE CULTURE  RESP PANEL BY RT-PCR (FLU A&B, COVID) ARPGX2  PROTIME-INR  APTT  CBC WITH DIFFERENTIAL/PLATELET  LACTIC ACID, PLASMA  CBC WITH DIFFERENTIAL/PLATELET  TROPONIN I (HIGH SENSITIVITY)    DG Chest Port 1 View  Final Result      Medications  lactated ringers bolus 1,000 mL (has no administration in time range)  Ampicillin-Sulbactam (UNASYN) 3 g in sodium chloride 0.9 % 100 mL IVPB (3 g Intravenous New Bag/Given 10/31/21 0254)  magnesium sulfate IVPB 2 g 50 mL (has no administration in time range)  propofol (DIPRIVAN) 1000 MG/100ML infusion (20 mcg/kg/min  45.5 kg Intravenous Rate/Dose Change 10/31/21 0235)  sodium chloride 0.9 % bolus 1,000 mL (1,000 mLs Intravenous New Bag/Given 10/31/21 0211)  etomidate (AMIDATE) injection 10 mg (10 mg Intravenous Given 10/31/21 0138)  rocuronium bromide 10 mg/mL (PF) syringe (80 mg Intravenous Given 10/31/21 0139)     Procedures  /  Critical Care .Critical Care Performed by: Sabas Sous, MD Authorized by: Sabas Sous, MD   Critical care provider statement:    Critical care time (minutes):  44   Critical care time was exclusive of:  Separately billable procedures and treating other patients   Critical care was necessary to treat or prevent imminent or life-threatening deterioration of the following conditions:  Respiratory failure   Critical care was time spent personally by me on the following activities:  Ordering and performing treatments and interventions, ordering and review of laboratory studies, ordering and review of radiographic studies, pulse oximetry, re-evaluation of patient's condition, review of old charts, examination of patient, evaluation of patient's response to treatment and discussions with consultants   Care discussed with: admitting  provider    ED Course and Medical Decision Making  I have reviewed the triage vital signs, the nursing notes, and pertinent available records from the EMR.  Listed above are laboratory and imaging tests that I personally ordered, reviewed, and interpreted and then considered in my medical decision making (see below for details).  Concern for sepsis, possibly aspiration pneumonia given patient's history of stroke, aphasia, recent vomiting, and now with hypoxic respiratory failure.  With nonrebreather here in the emergency department patient still with saturations in the 70s.  Very tachypneic.  Decision made to intubate for airway protection and improvement of oxygenation.  See separate note for procedure, which went smoothly.  Providing fluids, antibiotics, awaiting labs and x-ray, will admit to intensivist service.       Elmer Sow. Pilar Plate, MD [facility] Emergency  Medicine [facility] [email or web address]  Final Clinical Impressions(s) / ED Diagnoses     ICD-10-CM   1. Acute respiratory failure with hypoxia (HCC)  J96.01     2. Sepsis, due to unspecified organism, unspecified whether acute organ dysfunction present [facility])  A41.9       ED Discharge Orders     None        Discharge Instructions Discussed with and Provided to Patient:   Discharge Instructions   None       Sabas Sous, MD 10/31/21 [phone]

## 2021-10-31 NOTE — Progress Notes (Signed)
Assisted with transporting PT while on vent (100% Fi02) to [facility] then to [facility] ICU (Room 1232)- uneventful. ICU RT is aware the PT is now in ICU- no respiratory issues to resolve at this time.

## 2021-10-31 NOTE — Progress Notes (Signed)
Brief Nutrition Note RD working remotely.   Consult received for enteral/tube feeding initiation and management. OGT placed today at ~0200 ("satisfactory position" written in findings from CXR after placement).   Adult Enteral Nutrition Protocol initiated. Full assessment to follow. CCM note from today states refeeding risk and that thiamine supplementation has been ordered (100 mg IV/day).  Will start trickle-rate only for TF at this time.  Admitting Dx: Respiratory failure with hypoxia (HCC) [J96.91] Acute respiratory failure with hypoxia (HCC) [J96.01] Sepsis, due to unspecified organism, unspecified whether acute organ dysfunction present (HCC) [A41.9]  Body mass index is 19.59 kg/m. Pt meets criteria for normal weight based on current BMI.  Labs:  Recent Labs  Lab 10/31/21 0200  NA 134*  K 4.1  CL 95*  CO2 23  BUN 33*  CREATININE 1.26*  CALCIUM 8.9  GLUCOSE 358*      Trenton Gammon, MS, RD, LDN, CNSC Inpatient Clinical Dietitian RD pager # available in AMION  After hours/weekend pager # available in [facility]

## 2021-10-31 NOTE — ED Notes (Signed)
1:38 am 10 etomidate IV push 1:39am 80 roc IV push 1:40 am airway in 25 at the lip Verbal order to start propofol drip at 8mcg/min. Verbal order for 2nd liter LR

## 2021-10-31 NOTE — ED Notes (Signed)
Bear hugger blanket applied. Patients temperature foley shows 95.42F.

## 2021-10-31 NOTE — ED Notes (Signed)
Vitals 98.70F Rectal 78% 15L NRB HR 140 120/72 (85) 29 RR

## 2021-10-31 NOTE — Sepsis Progress Note (Signed)
Following per sepsis protocol   

## 2021-10-31 NOTE — ED Provider Notes (Signed)
Please see Dr. Acey Lav note for full H&P.  Intubation performed with supervising physician @ the bedside.   ED Course/Procedures    Procedure Name: Intubation Date/Time: 10/31/2021 1:45 AM Performed by: Cherly Anderson, PA-C Pre-anesthesia Checklist: Patient identified, Patient being monitored, Emergency Drugs available and Suction available Oxygen Delivery Method: Ambu bag Preoxygenation: Pre-oxygenation with 100% oxygen Induction Type: Rapid sequence Laryngoscope Size: Glidescope and 3 Grade View: Grade I Tube size: 7.5 mm Number of attempts: 1 Airway Equipment and Method: Stylet Placement Confirmation: ETT inserted through vocal cords under direct vision, CO2 detector and Breath sounds checked- equal and bilateral Secured at: 25 (@ the lip) cm Tube secured with: ETT holder Dental Injury: Teeth and Oropharynx as per pre-operative assessment            Cherly Anderson, PA-C 10/31/21 0210    Sabas Sous, MD 10/31/21 [phone]

## 2021-10-31 NOTE — ED Notes (Signed)
Per Dr. Karrie Doffing, regarding patients HR in 120s.  Can get an EKG and I'll have the day team follow up. If still sinus, not inclined to medicate.

## 2021-10-31 NOTE — Progress Notes (Addendum)
NAME:  Victor Copeland, MRN:  [id number], DOB:  [birth year], LOS: 0 ADMISSION DATE:  10/31/2021, CONSULTATION DATE:  10/31/2021 REFERRING MD:  Dr. Kennis Carina REASON FOR CONSULTATION: Respiratory failure  History of Present Illness:  51M with PMHx significant for CVA with right sided deficits and reported aphasia, rapidly progressive dementia with behavioral disturbances, and T2DM who presents from home with chief concern on the part of the family of nausea and vomiting.   On arrival by EMS was found to be hypoxic and started on NRB before presenting to [facility]. In the ED was found to still be hypoxic in the low 80s and in respiratory distress and was intubated emergently. No family at bedside to provide additional history at the time of my evaluation.  Pertinent  Medical History  1) CVA with right-sided deficits 2) Dementia with behavioral disturbances 3) Hypothyroidism 4) Type 2 Diabetes Mellitus 5) Hypertension 6) CKD  Significant Hospital Events: Including procedures, antibiotic start and stop dates in addition to other pertinent events   Intubated in the emergency department for hypoxia and admitted to ICU   Interval History   No acute issues since arriving to ICU. Sedation being weaned down.  Objective   Blood pressure 112/72, pulse (!) 122, temperature 98 F (36.7 C), temperature source Oral, resp. rate (!) 23, height 5' (1.524 m), weight 45.5 kg, SpO2 95 %.    Vent Mode: PRVC FiO2 (%):  [50 %-100 %] 50 % Set Rate:  [15 bmp] 15 bmp Vt Set:  [400 mL] 400 mL PEEP:  [5 cmH20] 5 cmH20 Plateau Pressure:  [16 cmH20-20 cmH20] 17 cmH20   Intake/Output Summary (Last 24 hours) at 10/31/2021 1117 Last data filed at 10/31/2021 0800 Gross per 24 hour  Intake 3155.34 ml  Output --  Net 3155.34 ml   Filed Weights   10/31/21 0214  Weight: 45.5 kg    Examination: General: intuabted, sedated HENT: anicteric sclera. L pupil 58mm R pupil 64mm both reactive Lungs: symmetric chest  expansion. Rhonchorous Cardiovascular: tachy, RR. warm Abdomen: nondistended, scaphoid Extremities: no cyanosis. No clubbing. Pulses intact Neuro: flicker to pain RUE, otherwise nothing on motor. Integument: no rash on exposed skin  Lactic ~7 AG 16 TSH 66 Glucose high  CXR with right > left alveolar opacities. Tubes ok  Assessment & Plan:   # Acute Hypoxic Respiratory Failure # Sepsis due to aspiration pneumonia Aspiration pneumonia/CAP.  - full vent support, lung protective settings - CPT - tracheal aspirate, ceftriaxone/azithromycin  - daily SAT/SBT as able - appears euvolemic but fluid tolerant without B-lines on bedside US, could trial another 500cc LR if having trouble with UOP/MAP later on today - rass 0 to -1  # Acute encephalopathy # Rapidly progressive dementia - vascular vs other # Unequal pupils On exam with low dose propofol L pupil 70mm, R pupil 82mm, flicker to pain in RUE otherwise nothing on motor exam. - MR brain stat - overall presentation doesn't seem c/w myxedema (hyperglycemic, not very hyponatremic, tachycardic) despite remarkably high TSH. Will give home dose for now - check BHB, ammonia - keep sedation light  #Diabetes mellitus with hyperglycemia - 6U levamir BID with SSI q4hrs. Titrate to goal - f/u HgbA1C - hold home orals - check BHB as above  #Lactic Acidosis May be in setting of sepsis, metformin exposure, dysregulated metabolic state in setting profound hypothyroid - trend  #Hypothyroidism - f/u t4 level - continue levothyroxine  # Severe protein calorie malnutrition - watch for refeeding -  thiamine - TF  Best Practice (right click and "Reselect all SmartList Selections" daily)   Diet/type: tubefeeds DVT prophylaxis: prophylactic heparin  GI prophylaxis: H2B Lines: N/A Foley:  Yes, and it is still needed Code Status:  full code Last date of multidisciplinary goals of care discussion [will attempt to call today)  Critical care  time: 38 minutes

## 2021-10-31 NOTE — ED Notes (Signed)
Dr. Sedonia Small at bedside. Verbal order for 10 etomidate, 80 roc. 1L Normal saline bolus. Rapid sedation kit ordered. Patient unable to protect airway.

## 2021-11-01 DIAGNOSIS — J189 Pneumonia, unspecified organism: Secondary | ICD-10-CM

## 2021-11-01 DIAGNOSIS — Z9911 Dependence on respirator [ventilator] status: Secondary | ICD-10-CM | POA: Diagnosis not present

## 2021-11-01 DIAGNOSIS — A419 Sepsis, unspecified organism: Principal | ICD-10-CM

## 2021-11-01 DIAGNOSIS — J9601 Acute respiratory failure with hypoxia: Secondary | ICD-10-CM | POA: Diagnosis not present

## 2021-11-01 LAB — URINE CULTURE: Culture: NO GROWTH

## 2021-11-01 LAB — PHOSPHORUS
Phosphorus: 2.6 mg/dL (ref 2.5–4.6)
Phosphorus: 3.4 mg/dL (ref 2.5–4.6)

## 2021-11-01 LAB — GLUCOSE, CAPILLARY
Glucose-Capillary: 43 mg/dL — CL (ref 70–99)
Glucose-Capillary: 56 mg/dL — ABNORMAL LOW (ref 70–99)
Glucose-Capillary: 69 mg/dL — ABNORMAL LOW (ref 70–99)
Glucose-Capillary: 121 mg/dL — ABNORMAL HIGH (ref 70–99)
Glucose-Capillary: 43 mg/dL — CL (ref 70–99)
Glucose-Capillary: 140 mg/dL — ABNORMAL HIGH (ref 70–99)
Glucose-Capillary: 22 mg/dL — CL (ref 70–99)
Glucose-Capillary: 160 mg/dL — ABNORMAL HIGH (ref 70–99)
Glucose-Capillary: 19 mg/dL — CL (ref 70–99)
Glucose-Capillary: 119 mg/dL — ABNORMAL HIGH (ref 70–99)

## 2021-11-01 LAB — BASIC METABOLIC PANEL
Anion gap: 6 (ref 5–15)
BUN: 35 mg/dL — ABNORMAL HIGH (ref 8–23)
CO2: 26 mmol/L (ref 22–32)
Calcium: 8.2 mg/dL — ABNORMAL LOW (ref 8.9–10.3)
Chloride: 104 mmol/L (ref 98–111)
Creatinine, Ser: 1 mg/dL (ref 0.61–1.24)
GFR, Estimated: >60 mL/min (ref >60)
Glucose, Bld: 68 mg/dL — ABNORMAL LOW (ref 70–99)
Potassium: 4 mmol/L (ref 3.5–5.1)
Sodium: 136 mmol/L (ref 135–145)

## 2021-11-01 LAB — CBC
HCT: 29.8 % — ABNORMAL LOW (ref 39.0–52.0)
Hemoglobin: 10.9 g/dL — ABNORMAL LOW (ref 13.0–17.0)
MCH: 31.7 pg (ref 26.0–34.0)
MCHC: 36.6 g/dL — ABNORMAL HIGH (ref 30.0–36.0)
MCV: 86.6 fL (ref 80.0–100.0)
Platelets: 225 10*3/uL (ref 150–400)
RBC: 3.44 MIL/uL — ABNORMAL LOW (ref 4.22–5.81)
RDW: 12.7 % (ref 11.5–15.5)
WBC: 13.4 10*3/uL — ABNORMAL HIGH (ref 4.0–10.5)
nRBC: 0 % (ref 0.0–0.2)

## 2021-11-01 LAB — CK: Total CK: 188 U/L (ref 49–397)

## 2021-11-01 LAB — MAGNESIUM
Magnesium: 2.2 mg/dL (ref 1.7–2.4)
Magnesium: 2.2 mg/dL (ref 1.7–2.4)

## 2021-11-01 MED ORDER — DEXTROSE 50 % IV SOLN: 25.0000 mL | Freq: Once | INTRAVENOUS | Status: DC

## 2021-11-01 MED ORDER — DEXTROSE 50 % IV SOLN
INTRAVENOUS | Status: AC
  Administered 2021-11-01: 50 mL
  Filled 2021-11-01: qty 50

## 2021-11-01 MED ORDER — ORAL CARE MOUTH RINSE
15.0000 mL | Freq: Two times a day (BID) | OROMUCOSAL | Status: DC
  Administered 2021-11-01 – 2021-11-05 (×9): 15 mL via OROMUCOSAL

## 2021-11-01 MED ORDER — DEXTROSE 50 % IV SOLN
INTRAVENOUS | Status: AC
  Filled 2021-11-01: qty 50

## 2021-11-01 MED ORDER — CHLORHEXIDINE GLUCONATE 0.12 % MT SOLN
15.0000 mL | Freq: Two times a day (BID) | OROMUCOSAL | Status: DC
  Administered 2021-11-01 – 2021-11-02 (×2): 15 mL via OROMUCOSAL
  Filled 2021-11-01: qty 15

## 2021-11-01 MED ORDER — DEXTROSE 50 % IV SOLN
12.5000 g | Freq: Once | INTRAVENOUS | Status: AC
  Administered 2021-11-01: 12.5 g via INTRAVENOUS

## 2021-11-01 MED ORDER — LEVOTHYROXINE SODIUM 100 MCG/5ML IV SOLN
25.0000 ug | Freq: Every day | INTRAVENOUS | Status: DC
  Administered 2021-11-02 – 2021-11-05 (×4): 25 ug via INTRAVENOUS
  Filled 2021-11-01 (×4): qty 5

## 2021-11-01 MED ORDER — LABETALOL HCL 5 MG/ML IV SOLN
5.0000 mg | INTRAVENOUS | Status: DC | PRN
  Administered 2021-11-01 – 2021-11-02 (×3): 5 mg via INTRAVENOUS
  Filled 2021-11-01 (×3): qty 4

## 2021-11-01 MED ORDER — DEXTROSE 50 % IV SOLN
25.0000 g | Freq: Once | INTRAVENOUS | Status: AC
  Administered 2021-11-01: 25 g via INTRAVENOUS

## 2021-11-01 NOTE — TOC Initial Note (Signed)
Transition of Care [facility]) - Initial/Assessment Note    Patient Details  Name: Victor Copeland MRN: [id number] Date of Birth: [birth year]  Transition of [facility]) CM/SW Contact:    Ross Ludwig, LCSW Phone Number: 11/01/2021, 4:17 PM  Clinical Narrative:                 Patient does not speak English assessment completed by reviewing patient's chart.  Patient lives with his adult kids, patient also has advanced dementia.  Patient's family expressed that he is too much to handle now, and they can not take care of him.  CSW received consult that patient needs SNF placement, CSW requested that PT and OT be ordered.  Patient may need short term rehab, then transition to LTC where family can know he is in a safe place.  CSW awaiting PT and OT recommendations before patient can be sent to SNF for bed offers.  Expected Discharge Plan: Skilled Nursing Facility Barriers to Discharge: Continued Medical Work up   Patient Goals and CMS Choice Patient states their goals for this hospitalization and ongoing recovery are:: To go to SNF for rehab.      Expected Discharge Plan and Services Expected Discharge Plan: [place] Choice: [place] arrangements for the past 2 months: Single Family Home                                      Prior Living Arrangements/Services Living arrangements for the past 2 months: Single Family Home Lives with:: Adult Children Patient language and need for interpreter reviewed:: Yes (Speaks Hmong) Do you feel safe going back to the place where you live?: No   Patient's family feel he needs short term rehab, and then possibly long term placement at SNF.  Need for Family Participation in Patient Care: Yes (Comment) Care giver support system in place?: No (comment)   Criminal Activity/Legal Involvement Pertinent to Current Situation/Hospitalization: Yes - Comment as needed  Activities of Daily Living       Permission Sought/Granted Permission sought to share information with : Case Manager, Family Supports Permission granted to share information with : Yes, Release of Information Signed  Share Information with NAME: Symes,[name] Daughter   [phone]  Nicholson, Horne   [phone]  Permission granted to share info w AGENCY: SNF admissions        Emotional Assessment Appearance:: Appears stated age   Affect (typically observed): Accepting, Appropriate, Calm Orientation: : Oriented to Self Alcohol / Substance Use: Not Applicable Psych Involvement: No (comment)  Admission diagnosis:  Respiratory failure with hypoxia (HCC) [J96.91] Acute respiratory failure with hypoxia (HCC) [J96.01] Sepsis, due to unspecified organism, unspecified whether acute organ dysfunction present [facility]) [A41.9] Patient Active Problem List   Diagnosis Date Noted   Sepsis ([place])    Pneumonia of right lower lobe due to infectious organism    Respiratory failure with hypoxia ([place]) 10/31/2021   Depression, major, single episode, moderate ([place]) 05/30/2021   Screening-pulmonary TB 05/30/2021   Urinary tract infection with hematuria 04/10/2021   AKI (acute kidney injury) ([place]) 04/10/2021   Nausea & vomiting 04/10/2021   Dementia associated with other underlying disease with behavioral disturbance 03/18/2021   Generalized weakness 03/18/2021   Encounter to establish care 02/23/2021   Type 2 diabetes mellitus without complication ([place]) [id number]   Essential hypertension 02/23/2021   Mixed  hyperlipidemia 02/23/2021   Acquired hypothyroidism 02/23/2021   History of ischemic stroke 02/23/2021   PCP:  Carlean Jews, NP Pharmacy:   [facility] - [place], [place] - [address] [place] [address] Phone: [phone] Fax: [phone]     Social Determinants of Health (SDOH) Interventions    Readmission Risk Interventions No flowsheet data found.

## 2021-11-01 NOTE — Plan of Care (Signed)
  Interdisciplinary Goals of Care Family Meeting   Date carried out:: 11/01/2021  Location of the meeting: Bedside  Member's involved: Bedside Registered Nurse, Family Member or next of kin, and Other: Physician Assistant  Durable Power of Attorney or acting medical decision maker: Wife    Discussion: We discussed goals of care for [employer] .  Pt's wife and daughter are at the bedside, they shared that pt's dementia and functional status have been steadily declining this year.  He mostly sleeps and needs assistance with ADL's, sometimes wanders at night and has been losing weight.  Daughter notes that pt has clearly stated in the past that he does not want to live on life support.  We discussed that if pt's status were to to worsen to the point of cardiac arrest, that it would be unlikely that chest compressions and life support would bring patient back to significant quality of life given his chronic issues.  Both daughter and wife agree and have requested DNR status.  They no longer feel that they can care for him at home and have added a social work and palliative care consult.   Code status: Full DNR  Disposition: Continue current acute care, dispo once ready for discharge TBD  Time spent for the meeting: 25 minutes  Darcella Gasman Kofi Murrell 11/01/2021, 12:49 PM

## 2021-11-01 NOTE — Progress Notes (Signed)
Initial Nutrition Assessment  DOCUMENTATION CODES:   Severe malnutrition in context of chronic illness, Underweight  INTERVENTION:  - will monitor for plan concerning nutrition. - will monitor for ongoing GOC discussions/decisions.   NUTRITION DIAGNOSIS:   Severe Malnutrition related to chronic illness (dementia) as evidenced by severe fat depletion, severe muscle depletion.  GOAL:   Patient will meet greater than or equal to 90% of their needs  MONITOR:   Diet advancement, Labs, Weight trends  REASON FOR ASSESSMENT:   Consult Enteral/tube feeding initiation and management  ASSESSMENT:   65 year old male with medical history of CVA with R-sided deficits and reported aphasia, rapidly progressive dementia with behavioral disturbances, HTN, HLD, hypothyroidism, and type 2 DM. he presented to the ED from home with concern for N/V. He was emergently intubated in the ED due to respiratory distress.  Patient discussed in rounds this AM.  He was intubated yesterday at ~0145 and OGT placed shortly after that time. He was extubated today at ~1100 and OGT removed at that time.   Yesterday he was started on trickle rate TF via OGT of Vital High Protein @ 20 ml/hr with 45 ml Prosource TF BID. This regimen provides 560 kcal, 64 grams protein, and 401 ml free water.   Patient noted to be disoriented x4. He was sleeping at the time of RD visit and did not awake even during NFPE. No visitors were present at the time of RD visit.   Able to talk with RN at bedside.   Weight today is 91 lb, weight yesterday was 100 lb, and PTA the most recently documented weight was 100 lb on 05/25/21.   Labs reviewed; CBGs: 43, 69, 56, 121, 43, 140, 22 mg/dl, BUN: 35 mg/dl, Ca: 8.2 mg/dl.  Medications reviewed; 20 mg IV pepcid BID, sliding scale novolog, 50 mcg synthroid/day, 100 mg IV thiamine/day.     NUTRITION - FOCUSED PHYSICAL EXAM:  Flowsheet Row Most Recent Value  Orbital Region Moderate  depletion  Upper Arm Region Moderate depletion  Thoracic and Lumbar Region Severe depletion  Buccal Region Severe depletion  Temple Region Severe depletion  Clavicle Bone Region Severe depletion  Clavicle and Acromion Bone Region Severe depletion  Scapular Bone Region Severe depletion  Dorsal Hand No depletion  Patellar Region Severe depletion  Anterior Thigh Region Severe depletion  Posterior Calf Region Unable to assess  Edema (RD Assessment) Mild  [BLE]  Hair Reviewed  Eyes Unable to assess  Mouth Unable to assess  Skin Reviewed  Nails Unable to assess       Diet Order:   Diet Order             Diet NPO time specified Except for: Other (See Comments)  Diet effective now                   EDUCATION NEEDS:   No education needs have been identified at this time  Skin:  Skin Assessment: Reviewed RN Assessment  Last BM:  11/6 per flow sheet documentation  Height:   Ht Readings from Last 1 Encounters:  10/31/21 5' (1.524 m)    Weight:   Wt Readings from Last 1 Encounters:  11/01/21 41.1 kg     Estimated Nutritional Needs:  Kcal:  1590-1820 kcal (35-40 kcal/kg IBW) Protein:  77-91 grams (1.7-2 grams/kg IBW) Fluid:  >/= 1.7 L/day     Trenton Gammon, MS, RD, LDN, CNSC Inpatient Clinical Dietitian RD pager # available in AMION  After  hours/weekend pager # available in [facility]

## 2021-11-01 NOTE — Consult Note (Signed)
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 He has had historically very difficult to manage behavior and symptoms at home and was in memory care in [place]. He is non-verbal at baseline.He was admitted with respiratory failure and aspiration with NV requiring intubation. He is currently in ICU s/p extubation with goals now for conservative medical management, DNR given his condition and QOL prior to admission.   On my visit, He was in no acute distress but appeared critically ill, severely weak and with failure to thrive. He was on heated high flow at 15 L requiring higher O2 requirements following extubation earlier this morning. He is not able to communicate meaningfully.  I spoke with his daughter by phone. I let her know that after extubation he has continued to aspirate and is struggling to breathe. He has increasing oxygen requirements. At the present time he is still receiving aggressive medical interventions.At this time he also has a very poor prognosis for making a recovery even back to his very debilitated baseline. I share this information with his daughter. I also recommended that we discuss a transition to comfort care.I recommended hospice care and at this time he would likely qualify for a hospice facility given his acute condition and needs for symptom management.He may also have a hospital death given his high O2 demand at the present time.   His daughter is going to discuss this information with her mother and we have planned to readdress goals of care tomorrow.   Recommendations:  DNR I have strongly recommended a transition to comfort care at best we can return him to an already very debilitated and dependent QOL-He's likely going to continue to aspirate as his dementia progresses.I would not recommend  placement of a feeding tube as it will not reduce his risk of aspiration with dementia and cause additional complications.  Prognosis: he is likely approaching the end of life it would be completely reasonable to initiate him on dyspnea and pain medication and titrate his oxygen to the lowest possible flow for his comfort and allow for a natural death to occur with comfort and dignity.  Would not escalate his current medical interventions. Fortunately at this time he does not appear to be suffering. We'll plan on touching base with family tomorrow to further determine his course of care. Disposition: I do not believe that this patient Will survive his hospitalization, if he does he would be appropriate for hospice facility if he can transfer. He will not under any circumstances be able to meaningfully rehab or qualify for a rehab SNF.  Anderson Malta, DO Palliative Medicine  Time:70 min Greater than 50%  of this time was spent counseling and coordinating care related to the above assessment and plan.

## 2021-11-01 NOTE — Progress Notes (Signed)
NAME:  Victor Copeland, MRN:  [id number], DOB:  11-23-1956, LOS: 1 ADMISSION DATE:  10/31/2021, CONSULTATION DATE:  10/31/2021 REFERRING MD:  Dr. Kennis Carina REASON FOR CONSULTATION: Respiratory failure  History of Present Illness:  24M with PMHx significant for CVA with right sided deficits and reported aphasia, rapidly progressive dementia with behavioral disturbances, and T2DM who presents from home with chief concern on the part of the family of nausea and vomiting.   On arrival by EMS was found to be hypoxic and started on NRB before presenting to [facility]. In the ED was found to still be hypoxic in the low 80s and in respiratory distress and was intubated emergently. No family at bedside to provide additional history at the time of my evaluation.  Pertinent  Medical History  1) CVA with right-sided deficits 2) Dementia with behavioral disturbances 3) Hypothyroidism 4) Type 2 Diabetes Mellitus 5) Hypertension 6) CKD  Significant Hospital Events: Including procedures, antibiotic start and stop dates in addition to other pertinent events   Intubated in the emergency department for hypoxia and admitted to ICU 11/7 Tolerating SBT/SAT, plan for extubation   Interval History   No overnight events  MRI brain with ventriculomegaly, no acute CVA Glucose 68 Remains on Ceftriaxone and Azithromycin for CAP coverage UOP 700cc yesterday   Objective   Blood pressure (!) 144/66, pulse 94, temperature 99.7 F (37.6 C), resp. rate (!) 21, height 5' (1.524 m), weight 41.1 kg, SpO2 100 %.    Vent Mode: PSV;CPAP FiO2 (%):  [30 %-50 %] 30 % Set Rate:  [15 bmp] 15 bmp Vt Set:  [400 mL] 400 mL PEEP:  [5 cmH20] 5 cmH20 Pressure Support:  [10 cmH20] 10 cmH20 Plateau Pressure:  [8 cmH20] 8 cmH20   Intake/Output Summary (Last 24 hours) at 11/01/2021 7342 Last data filed at 11/01/2021 0755 Gross per 24 hour  Intake 870.91 ml  Output 780 ml  Net 90.91 ml    Filed Weights   10/31/21 0214  11/01/21 0650  Weight: 45.5 kg 41.1 kg      General:  thin, poorly nourished M, arousable to voice HEENT: MM pink/moist, ETT in place, pupils equal and responsive Neuro: examined off sedation, he is resting with eyes closed, opens eyes to voice and tracks, not following commands CV: s1s2 rrr, no m/r/g PULM:  mechanically ventilated, no tachypnea, trace bilateral rhonchi in bases without wheezing GI: soft, bsx4 active  Extremities: warm/dry, poor muscle tone no edema  Skin: no rashes or lesions    Assessment & Plan:   Acute Hypoxic Respiratory Failure Sepsis due to aspiration pneumonia Aspiration pneumonia/CAP.  -tolerating SBT/SAT though somewhat somnolent, given history of dementia and prior CVA may be close to his baseline, plan for extubation -continue Ceftriaxone and Azithromycin  -flu/covid negative, MRSA swab neg and no growth thus far on respiratory culture     Acute encephalopathy Rapidly progressive dementia - vascular vs other Unequal pupils Improving, more awake today Spoke with patient's daughter, he functional status has been worsening to the point his wife feels that she can no longer take care of him, she would prefer DNR status, will discuss with pt's wife when arrives to the hospital -MR brain shows ventriculomegaly without other acute findings -ammonia and BHB ok -TSH elevated, though overall presentation not consistent with myxedema -sedation weaned -consult SW and palliative care    Diabetes mellitus with hyperglycemia Hypoglycemic today -hold Levemir and home orals, continue SSI -bedside swallow post-extubation, may need cortrak  Lactic Acidosis May be in setting of sepsis, metformin exposure, dysregulated metabolic state in setting profound hypothyroid -down-trended  Hypothyroidism - T4 WNL, continue levothyroxine  Severe protein calorie malnutrition POA Watch for refeeding - thiamine -PT/OT consult  Best Practice (right click and  "Reselect all SmartList Selections" daily)   Diet/type: tubefeeds DVT prophylaxis: prophylactic heparin  GI prophylaxis: H2B Lines: N/A Foley:  Yes, and it is still needed Code Status:  full code Last date of multidisciplinary goals of care discussion (daughter updated 11/7, further GOC discussions with wife later today)  Critical care time: 42 minutes     CRITICAL CARE Performed by: Darcella Gasman Julio Zappia   Total critical care time: 42 minutes  Critical care time was exclusive of separately billable procedures and treating other patients.  Critical care was necessary to treat or prevent imminent or life-threatening deterioration.  Critical care was time spent personally by me on the following activities: development of treatment plan with patient and/or surrogate as well as nursing, discussions with consultants, evaluation of patient's response to treatment, examination of patient, obtaining history from patient or surrogate, ordering and performing treatments and interventions, ordering and review of laboratory studies, ordering and review of radiographic studies, pulse oximetry and re-evaluation of patient's condition.   Darcella Gasman Catelyn Friel, PA-C  Pulmonary & Critical care See Amion for pager If no response to pager , please call [phone] until 7pm After 7:00 pm call Elink  [phone]

## 2021-11-01 NOTE — Procedures (Signed)
Extubation Procedure Note  Patient Details:   Name: Victor Copeland DOB: [birth year] MRN: [id number]   Airway Documentation:    Vent end date: 11/01/21 Vent end time: 1058   Evaluation  O2 sats: stable throughout Complications: No apparent complications Patient did tolerate procedure well. Bilateral Breath Sounds: Clear, Diminished   No  Pt extubated to 2L [place] per CCMD order. Pt tolerated procedure well. Pt was suctioned and had a positive cuff leak prior to extubation. No stridor is heard at this time. Pt was unable to talk afterwards due to language barrier/dementia. CCMD is aware about this.   Tachina Spoonemore A Kasarah Sitts 11/01/2021, 10:58 AM

## 2021-11-02 DIAGNOSIS — E039 Hypothyroidism, unspecified: Secondary | ICD-10-CM

## 2021-11-02 DIAGNOSIS — F02818 Dementia in other diseases classified elsewhere, unspecified severity, with other behavioral disturbance: Secondary | ICD-10-CM

## 2021-11-02 DIAGNOSIS — J9601 Acute respiratory failure with hypoxia: Secondary | ICD-10-CM | POA: Diagnosis not present

## 2021-11-02 DIAGNOSIS — E43 Unspecified severe protein-calorie malnutrition: Secondary | ICD-10-CM

## 2021-11-02 LAB — GLUCOSE, CAPILLARY
Glucose-Capillary: 107 mg/dL — ABNORMAL HIGH (ref 70–99)
Glucose-Capillary: 115 mg/dL — ABNORMAL HIGH (ref 70–99)
Glucose-Capillary: 130 mg/dL — ABNORMAL HIGH (ref 70–99)
Glucose-Capillary: 142 mg/dL — ABNORMAL HIGH (ref 70–99)
Glucose-Capillary: 159 mg/dL — ABNORMAL HIGH (ref 70–99)
Glucose-Capillary: 165 mg/dL — ABNORMAL HIGH (ref 70–99)
Glucose-Capillary: 178 mg/dL — ABNORMAL HIGH (ref 70–99)
Glucose-Capillary: 25 mg/dL — CL (ref 70–99)
Glucose-Capillary: 36 mg/dL — CL (ref 70–99)
Glucose-Capillary: 53 mg/dL — ABNORMAL LOW (ref 70–99)
Glucose-Capillary: 72 mg/dL (ref 70–99)

## 2021-11-02 LAB — CBC
HCT: 32.3 % — ABNORMAL LOW (ref 39.0–52.0)
Hemoglobin: 11.4 g/dL — ABNORMAL LOW (ref 13.0–17.0)
MCH: 31.5 pg (ref 26.0–34.0)
MCHC: 35.3 g/dL (ref 30.0–36.0)
MCV: 89.2 fL (ref 80.0–100.0)
Platelets: 213 10*3/uL (ref 150–400)
RBC: 3.62 MIL/uL — ABNORMAL LOW (ref 4.22–5.81)
RDW: 12.9 % (ref 11.5–15.5)
WBC: 12.8 10*3/uL — ABNORMAL HIGH (ref 4.0–10.5)
nRBC: 0 % (ref 0.0–0.2)

## 2021-11-02 LAB — MAGNESIUM: Magnesium: 2.2 mg/dL (ref 1.7–2.4)

## 2021-11-02 LAB — BASIC METABOLIC PANEL
Anion gap: 8 (ref 5–15)
BUN: 31 mg/dL — ABNORMAL HIGH (ref 8–23)
CO2: 26 mmol/L (ref 22–32)
Calcium: 8.3 mg/dL — ABNORMAL LOW (ref 8.9–10.3)
Chloride: 105 mmol/L (ref 98–111)
Creatinine, Ser: 0.89 mg/dL (ref 0.61–1.24)
GFR, Estimated: >60 mL/min (ref >60)
Glucose, Bld: 59 mg/dL — ABNORMAL LOW (ref 70–99)
Potassium: 3.2 mmol/L — ABNORMAL LOW (ref 3.5–5.1)
Sodium: 139 mmol/L (ref 135–145)

## 2021-11-02 LAB — LEGIONELLA PNEUMOPHILA SEROGP 1 UR AG: L. pneumophila Serogp 1 Ur Ag: NEGATIVE

## 2021-11-02 MED ORDER — DEXTROSE 50 % IV SOLN
INTRAVENOUS | Status: AC
  Administered 2021-11-02: 50 mL via INTRAVENOUS
  Filled 2021-11-02: qty 50

## 2021-11-02 MED ORDER — DIPHENHYDRAMINE HCL 50 MG/ML IJ SOLN: 25.0000 mg | INTRAMUSCULAR | Status: DC | PRN

## 2021-11-02 MED ORDER — GLYCOPYRROLATE 0.2 MG/ML IJ SOLN: 0.2000 mg | INTRAMUSCULAR | Status: DC | PRN

## 2021-11-02 MED ORDER — DEXTROSE IN LACTATED RINGERS 5 % IV SOLN
INTRAVENOUS | Status: DC
Start: 2021-11-02 — End: 2021-11-04
  Filled 2021-11-02: qty 1000

## 2021-11-02 MED ORDER — POTASSIUM CHLORIDE 10 MEQ/100ML IV SOLN
10.0000 meq | INTRAVENOUS | Status: AC
  Administered 2021-11-02 (×5): 10 meq via INTRAVENOUS
  Filled 2021-11-02 (×6): qty 100

## 2021-11-02 MED ORDER — ENOXAPARIN SODIUM 30 MG/0.3ML IJ SOSY: 30.0000 mg | PREFILLED_SYRINGE | Freq: Every day | INTRAMUSCULAR | Status: DC

## 2021-11-02 MED ORDER — DEXTROSE 50 % IV SOLN: 25.0000 g | INTRAVENOUS | Status: AC

## 2021-11-02 MED ORDER — INSULIN ASPART 100 UNIT/ML IJ SOLN: 0.0000 [IU] | Freq: Three times a day (TID) | INTRAMUSCULAR | Status: DC

## 2021-11-02 MED ORDER — ACETAMINOPHEN 325 MG PO TABS: 650.0000 mg | ORAL_TABLET | Freq: Four times a day (QID) | ORAL | Status: DC | PRN

## 2021-11-02 MED ORDER — LABETALOL HCL 5 MG/ML IV SOLN
10.0000 mg | INTRAVENOUS | Status: DC | PRN
  Administered 2021-11-02: 10 mg via INTRAVENOUS
  Filled 2021-11-02 (×2): qty 4

## 2021-11-02 MED ORDER — GLYCOPYRROLATE 1 MG PO TABS
1.0000 mg | ORAL_TABLET | ORAL | Status: DC | PRN
  Filled 2021-11-02: qty 1

## 2021-11-02 MED ORDER — ACETAMINOPHEN 650 MG RE SUPP: 650.0000 mg | Freq: Four times a day (QID) | RECTAL | Status: DC | PRN

## 2021-11-02 MED ORDER — MORPHINE SULFATE (PF) 2 MG/ML IV SOLN
2.0000 mg | INTRAVENOUS | Status: DC | PRN
  Administered 2021-11-02 – 2021-11-05 (×7): 2 mg via INTRAVENOUS
  Filled 2021-11-02 (×7): qty 1

## 2021-11-02 MED ORDER — DEXTROSE 50 % IV SOLN
INTRAVENOUS | Status: AC
  Administered 2021-11-02: 25 g via INTRAVENOUS
  Filled 2021-11-02: qty 50

## 2021-11-02 MED ORDER — POLYVINYL ALCOHOL 1.4 % OP SOLN: 1.0000 [drp] | Freq: Four times a day (QID) | OPHTHALMIC | Status: DC | PRN

## 2021-11-02 NOTE — Progress Notes (Signed)
[facility] ADULT ICU REPLACEMENT PROTOCOL   The patient does apply for the [facility] Adult ICU Electrolyte Replacment Protocol based on the criteria listed below:   1.Exclusion criteria: TCTS patients, ECMO patients, and Dialysis patients 2. Is GFR >/= 30 ml/min? Yes.    Patient's GFR today is >60 3. Is SCr </= 2? Yes.   Patient's SCr is 0.89 mg/dL 4. Did SCr increase >/= 0.5 in 24 hours? No. 5.Pt's weight >40kg  Yes.   6. Abnormal electrolyte(s): K+ 3.2  7. Electrolytes replaced per protocol 8.  Call MD STAT for K+ </= 2.5, Phos </= 1, or Mag </= 1 Physician:  n/a  Victor Copeland 11/02/2021 4:56 AM

## 2021-11-02 NOTE — Evaluation (Signed)
Clinical/Bedside Swallow Evaluation Patient Details  Name: Victor Copeland MRN: [id number] Date of Birth: Oct 22, 1956  Today's Date: 11/02/2021 Time: SLP Start Time (ACUTE ONLY): 1135 SLP Stop Time (ACUTE ONLY): 1205 SLP Time Calculation (min) (ACUTE ONLY): 30 min  Past Medical History:  Past Medical History:  Diagnosis Date   DM (diabetes mellitus) (HCC)    HLD (hyperlipidemia)    HTN (hypertension)    Hypothyroid    Stroke [facility])    Past Surgical History: History reviewed. No pertinent surgical history. HPI:  Victor Copeland is a 65 y/o gentleman with a history of CVA and dementia who presented with vomiting and severe respiratory failure due to aspiration pneumonitis.   Pt PMH + for CVA left parietal, bilateral temporal lobe volume loss.  Pt is on HHFNC 15 Liters - Pt was intubated.  CVA with R-sided deficits and reported aphasia, rapidly progressive dementia with behavioral disturbances, HTN, HLD, hypothyroidism, and type 2 DM. he presented to the ED from home with concern for N/V. He was emergently intubated in the ED due to respiratory distress.   Pt required intubation x1 day - s/p extubation 11/01/2021 and swallow eval ordered.  Palliative meeting conducted with plan for Dr Phillips Odor to follow up - she recommended comfort care.  CXR showed right lobe pna.  Per SLP conversation with family, they deny pt coughing with po at home PTA.    Assessment / Plan / Recommendation  Clinical Impression  Patient lying in bed, contracted and leaning to his left side (per wife, he always leans to the left).  He has language deficits due to his CVA and ? impact of dementia also.  Family advises pt does not feed himself at home and eats soups, rice soups, etc without any difficulty nor coughing per spouse/daughter *(Victor Copeland).  Pt is grossly weak and demonstrates weak non-productive cough after repositioned in bed.  SLP provided oral care and po trials including nectar liquids, ice cream, and applesauce.  Delayed swallow  (clinical indication of oral and pharyngeal) noted with post-swallow cough noted with 90% of boluses.  Pt did not clear secretions/laryngeal congestion despite cues for throat clearing/cough and swallows.  Initially pt was opening his mouth readily to accept po intake (family reports he was opening his mouth yesterday also) -- but after approx 7th bolus, he stopped and admitted to discomfort, sensation of retention.  His swallow function at this time is not conducive to adequacy of po intake nor airway protection.  At that time, ceased offering po and encouraged pt to stay upright, continue to try to cough to clear.  In discussion with daughter Victor Copeland* and pt's spouse, Victor Copeland reports family desires for pt to be comfort care and allow intake for comfort.  Educated family to concept of "comfort po" being only few bites and sips for enjoyment - not nutritional value. Will follow up x1 to assess for improvement after extubation and for family education. SLP Visit Diagnosis: Dysphagia, unspecified (R13.10);Dysphagia, oropharyngeal phase (R13.12)    Aspiration Risk  Severe aspiration risk;Risk for inadequate nutrition/hydration    Diet Recommendation  (? comfort po ?)   Medication Administration: Whole meds with puree Supervision: Staff to assist with self feeding;Full supervision/cueing for compensatory strategies Compensations: Slow rate;Small sips/bites (stop intake if pt coughing, few bites/sips only) Postural Changes: Seated upright at 90 degrees;Remain upright for at least 30 minutes after po intake    Other  Recommendations Oral Care Recommendations: Oral care before and after PO    Recommendations for follow  up therapy are one component of a multi-disciplinary discharge planning process, led by the attending physician.  Recommendations may be updated based on patient status, additional functional criteria and insurance authorization.  Follow up Recommendations No SLP follow up      Assistance  Recommended at Discharge Frequent or constant Supervision/Assistance  Functional Status Assessment    Frequency and Duration min 1 x/week  1 week       Prognosis Prognosis for Safe Diet Advancement: Guarded Barriers to Reach Goals: Severity of deficits;Cognitive deficits      Swallow Study   General HPI: Victor Copeland is a 65 y/o gentleman with a history of CVA and dementia who presented with vomiting and severe respiratory failure due to aspiration pneumonitis.   Pt PMH + for CVA left parietal, bilateral temporal lobe volume loss.  Pt is on HHFNC 15 Liters - Pt was intubated.  CVA with R-sided deficits and reported aphasia, rapidly progressive dementia with behavioral disturbances, HTN, HLD, hypothyroidism, and type 2 DM. he presented to the ED from home with concern for N/V. He was emergently intubated in the ED due to respiratory distress.   Pt required intubation x1 day - s/p extubation 11/01/2021 and swallow eval ordered.  Palliative meeting conducted with plan for Dr Phillips Odor to follow up - she recommended comfort care.  CXR showed right lobe pna.  Per SLP conversation with family, they deny pt coughing with po at home PTA. Type of Study: Bedside Swallow Evaluation Diet Prior to this Study: NPO Temperature Spikes Noted: No Respiratory Status: Nasal cannula History of Recent Intubation: Yes Length of Intubations (days): 1 days Date extubated: 11/02/21 Behavior/Cognition: Alert;Cooperative Oral Cavity Assessment: Dry Oral Care Completed by SLP: Yes Self-Feeding Abilities: Total assist (Right hemiparesis, Mitt on left hand) Patient Positioning: Upright in bed Baseline Vocal Quality: Low vocal intensity Volitional Cough: Weak;Congested Volitional Swallow: Unable to elicit    Oral/Motor/Sensory Function Overall Oral Motor/Sensory Function:  (pt did not follow directions fully for oral motor exam, able to seal lips on straw and spoon)   Ice Chips Ice chips: Impaired Presentation:  Spoon Pharyngeal Phase Impairments: Suspected delayed Swallow;Cough - Delayed   Thin Liquid Thin Liquid: Impaired Presentation: Spoon Pharyngeal  Phase Impairments: Suspected delayed Swallow;Cough - Immediate    Nectar Thick Nectar Thick Liquid: Impaired Presentation: Straw;Spoon Pharyngeal Phase Impairments: Suspected delayed Swallow;Cough - Delayed   Honey Thick Honey Thick Liquid: Not tested   Puree Puree: Impaired Presentation: Spoon Pharyngeal Phase Impairments: Suspected delayed Swallow;Multiple swallows;Cough - Delayed   Solid     Solid: Not tested     Rolena Infante, MS [facility] SLP Acute Rehab Services Office [phone] Pager [phone]  Chales Abrahams 11/02/2021,4:33 PM

## 2021-11-02 NOTE — Progress Notes (Signed)
NAME:  Victor Copeland, MRN:  [id number], DOB:  12/29/1955, LOS: 2 ADMISSION DATE:  10/31/2021, CONSULTATION DATE:  10/31/2021 REFERRING MD:  Dr. Kennis Carina REASON FOR CONSULTATION: Respiratory failure  History of Present Illness:  11M with PMHx significant for CVA with right sided deficits and reported aphasia, rapidly progressive dementia with behavioral disturbances, and T2DM who presents from home with chief concern on the part of the family of nausea and vomiting.   On arrival by EMS was found to be hypoxic and started on NRB before presenting to [facility]. In the ED was found to still be hypoxic in the low 80s and in respiratory distress and was intubated emergently. No family at bedside to provide additional history at the time of my evaluation.  Pertinent  Medical History  1) CVA with right-sided deficits 2) Dementia with behavioral disturbances 3) Hypothyroidism 4) Type 2 Diabetes Mellitus 5) Hypertension 6) CKD  Significant Hospital Events: Including procedures, antibiotic start and stop dates in addition to other pertinent events   Intubated in the emergency department for hypoxia and admitted to ICU 11/7 Tolerating SBT/SAT, plan for extubation 11/8 extubated yesterday, initially encephalopathic and aspirating, family made DNR and palliative consulted.  He looks much more awake today   Interval History   Started on D5 infusion overnight for hypoglycemia Asking for ice cream today  Objective   Blood pressure (!) 190/114, pulse 78, temperature (!) 97.5 F (36.4 C), temperature source Axillary, resp. rate 17, height 5' (1.524 m), weight 41.8 kg, SpO2 100 %.    FiO2 (%):  [30 %] 30 %   Intake/Output Summary (Last 24 hours) at 11/02/2021 0829 Last data filed at 11/02/2021 0400 Gross per 24 hour  Intake 369.58 ml  Output 650 ml  Net -280.42 ml    Filed Weights   10/31/21 0214 11/01/21 0650 11/02/21 0500  Weight: 45.5 kg 41.1 kg 41.8 kg    General:  thin, poorly  nourished elderly M, sleeping in bed in no acute distress HEENT: MM pink/dry, PERRLA Neuro: opens eyes to voice, answers questions today, much more awake  CV: s1s2 rrr, no m/r/g PULM:  seen on 15L [place], lungs diminished in bases but no overt wheezing or rhonchi, no distress GI: soft, bsx4 active  Extremities: warm/dry, poor muscle tone, no edema  Skin: no rashes or lesions    Assessment & Plan:   Acute Hypoxic Respiratory Failure Sepsis due to aspiration pneumonia Aspiration pneumonia/CAP.  -extubated successfully yesterday, initial concern that he was aspirating and had increasing O2 needs, brought family in and they confirmed that he had previously stated clearly he did not want life support so changed to DNR and consulted palliative -this morning he continues on HFNC, but is stable and clearing secretions better -complete course of Ceftriaxone and Azithromycin, no growth on cultures this for  -suspect is chronically aspirating based on family report, repeat speech eval today now is more awake.  M     Acute encephalopathy Rapidly progressive dementia - vascular vs other Unequal pupils Improving, more awake today Spoke with patient's daughter, he functional status has been worsening to the point his wife feels that she can no longer take care of him. MRI brain showed ventriculomegaly without other acute findings -encephalopathy improving -PT/OT consults -family feels can no longer take care of him at home, social work consult to help with placement options and appreciate palliative care input    Diabetes mellitus with hyperglycemia Hypoglycemic today and started on D5 infusion -continue  holding Levemir and home orals, continue SSI -swallow eval, may need cortrak     Lactic Acidosis May be in setting of sepsis, metformin exposure, dysregulated metabolic state in setting profound hypothyroid -down-trended  Hypothyroidism - T4 WNL, continue levothyroxine  Severe protein  calorie malnutrition POA Watch for refeeding - thiamine -PT/OT consult  Best Practice (right click and "Reselect all SmartList Selections" daily)   Diet/type: NPO, swallow screen and advance as tolerated  DVT prophylaxis: prophylactic heparin  GI prophylaxis: H2B Lines: N/A Foley:  removal ordered  Code Status:  DNR Last date of multidisciplinary goals of care discussion (11/8 now DNR)  Critical care time: 37 minutes     CRITICAL CARE Performed by: Darcella Gasman Tobie Perdue   Total critical care time: 37 minutes  Critical care time was exclusive of separately billable procedures and treating other patients.  Critical care was necessary to treat or prevent imminent or life-threatening deterioration.  Critical care was time spent personally by me on the following activities: development of treatment plan with patient and/or surrogate as well as nursing, discussions with consultants, evaluation of patient's response to treatment, examination of patient, obtaining history from patient or surrogate, ordering and performing treatments and interventions, ordering and review of laboratory studies, ordering and review of radiographic studies, pulse oximetry and re-evaluation of patient's condition.   Darcella Gasman Yanelly Cantrelle, PA-C [facility] See Amion for pager If no response to pager , please call [phone] until 7pm After 7:00 pm call Elink  [phone]

## 2021-11-02 NOTE — Progress Notes (Addendum)
BSE completed - full report to follow.  Daughter, [name] and pt's wife present and were educated and provided interpretation.  Pt's family agreeable to comfort care/hospice if pt can go transition to long term care/hospice house.  Recommend comfort feeding only, few bites and sips.  Pt with cough at baseline, increasing with all po boluses.   After approx 7 boluses of nectar, ice cream and applesauce, pt stopped opening his mouth to accept more, concerning for discomfort with po.  Family *son and pt's wife* educated to concept of comfort feeding and precautions.   Rolena Infante, MS [facility] SLP Acute Rehab Services Office [phone] Pager [phone]

## 2021-11-02 NOTE — Progress Notes (Signed)
eLink Physician-Brief Progress Note Patient Name: Victor Copeland DOB: November 14, 1956 MRN: [id number]   Date of Service  11/02/2021  HPI/Events of Note  BP still elevated, blood sugar running on the low normal side.  eICU Interventions  PRN labetalol dose increased to 10 mg, D 5 % LR gtt ordered at 50 ml / hour.        Thomasene Lot Vincent Ehrler 11/02/2021, 5:43 AM

## 2021-11-02 NOTE — Progress Notes (Signed)
PT Cancellation Note  Patient Details Name: Victor Copeland MRN: [id number] DOB: July 22, 1956   Cancelled Treatment:    Reason Eval/Treat Not Completed: Medical issues which prohibited therapy, BP running high, Palliative consult pending. Will follow and eval when stable and indicated.   Rada Hay 11/02/2021, 7:29 AM Blanchard Kelch PT Acute Rehabilitation Services Pager [phone] Office [phone]

## 2021-11-02 NOTE — Plan of Care (Signed)
Goals of Care Discussion:   Pt has been more awake today, but with continued hypoglycemia and was evaluated by speech therapy who noted coughing with all po boluses.  Discussed with patient's daughter and wife and multiple family members over the phone.  They feel ready to transition to comfort based care and comfort feeding and pursue hospice placement.     Darcella Gasman Melanie Pellot, PA-C [place] [facility] See Amion for pager If no response to pager , please call [phone] until 7pm After 7:00 pm call Elink  [phone]

## 2021-11-02 NOTE — Progress Notes (Signed)
OT Cancellation Note  Patient Details Name: Victor Copeland MRN: [id number] DOB: 05-21-1956   Cancelled Treatment:    Reason Eval/Treat Not Completed: Medical issues which prohibited therapy: BP currently 190/114, Palliative consult pending. Will follow and eval when stable and indicated.  Theodoro Clock 11/02/2021, 9:02 AM

## 2021-11-03 DIAGNOSIS — E162 Hypoglycemia, unspecified: Secondary | ICD-10-CM

## 2021-11-03 DIAGNOSIS — J189 Pneumonia, unspecified organism: Secondary | ICD-10-CM

## 2021-11-03 LAB — GLUCOSE, CAPILLARY
Glucose-Capillary: 161 mg/dL — ABNORMAL HIGH (ref 70–99)
Glucose-Capillary: 186 mg/dL — ABNORMAL HIGH (ref 70–99)
Glucose-Capillary: 189 mg/dL — ABNORMAL HIGH (ref 70–99)
Glucose-Capillary: 190 mg/dL — ABNORMAL HIGH (ref 70–99)
Glucose-Capillary: 170 mg/dL — ABNORMAL HIGH (ref 70–99)

## 2021-11-03 MED ORDER — LABETALOL HCL 5 MG/ML IV SOLN
10.0000 mg | INTRAVENOUS | Status: DC | PRN
  Administered 2021-11-03 – 2021-11-04 (×3): 10 mg via INTRAVENOUS
  Filled 2021-11-03 (×3): qty 4

## 2021-11-03 MED ORDER — HYDRALAZINE HCL 20 MG/ML IJ SOLN: 10.0000 mg | INTRAMUSCULAR | Status: DC | PRN

## 2021-11-03 NOTE — Progress Notes (Signed)
NUTRITION NOTE  Patient seen by this RD for full assessment on 10/7, the date on which patient was extubated and OGT removed.   Patient has remained NPO since that time and failed swallow evaluation conducted by SLP.   Patient is DNR. Palliative Care is following and met with family earlier today.   Per Palliative Care MD note, plan is for allowance of comfort feeding as patient has been requesting and enjoying ice cream when it is fed to him by someone other than himself.  Palliative Care note states prognosis may be <2 weeks at this point in time.   RD will sign off at this time. If nutrition-related needs arise, please re-consult RD.     Jarome Matin, MS, RD, LDN, CNSC Inpatient Clinical Dietitian RD pager # available in [place]  After hours/weekend pager # available in [facility]

## 2021-11-03 NOTE — Progress Notes (Signed)
Meeting planned for 10:30 today with family present.

## 2021-11-03 NOTE — Hospital Course (Signed)
[name] is a 65 yo Male with PMHx significant for 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   On arrival by EMS was found to be hypoxic and started on NRB before presenting to [facility]. In the ED was found to still be hypoxic in the low 80s and in respiratory distress and was intubated emergently. He was extubated on 11/7 and continued to have ongoing encephalopathy and concern for ongoing aspiration.  Multiple GOC discussions were held and family ultimately elected to transition to comfort care/hospice approach.

## 2021-11-03 NOTE — Assessment & Plan Note (Signed)
-   see sepsis 

## 2021-11-03 NOTE — Assessment & Plan Note (Signed)
-   Suspected due to aspiration event - Extubated and patient now transitioned to DNR and comfort care approach - Continue oxygen for comfort

## 2021-11-03 NOTE — Progress Notes (Signed)
Palliative Care Family Meeting  Met with patient, his daughter and DIL at bedside to discuss goals of care. He remains extremely fragile medically. He is high risk for acute aspiration event or worsening clinical status related to his severe malnutrition, immobility (has chronic lower extremity contractures and is at baseline bedbound). He completed treatment for his aspiration PNA and has stabilized at an O2 req of 2L nasal cannula- he is off high flow. He is getting continuous D5 infusion for persistent hypoglycemia.  I explained to his daughter that there is no added benefit or value to artifical feeding since there is not a reversible condition-his dementia and neurological decline are progressive and already in the end stages (mostly non-verbal, bedbound, dysphagia). He has failed swallow eval-but will eat ice cream and seems to enjoy this when carefully hand fed-family understand risks with PO-we discussed careful hand feeding approach which they did prior to admission.  He is in the end stages of dementia and late effect stroke.He is most likely approaching EOL. Prognosis may very well be <2 weeks and at any time I anticipate he will have an event that causes terminal decline. Comfort Care is the goal and to not prolong his suffering.  Disposition Options: Really only limited options- Home with Hospice, LTC with hospice or Hospice Facility in the even of terminal decline. Family feel that they cannot care for him at home due to his completely dependent condition and medical complexity.   Will reduce his D5 infusion by half and provided modified diet with full assisit for comfort-he will likely not be able to sustain his nutrition. He was also agitated last PM but responded well to PRN IV morphine.  Lane Hacker, DO Palliative Medicine

## 2021-11-03 NOTE — Progress Notes (Signed)
Report given to 3W RN; Pt transported via bed to room 1342 without s/s.  Pt daughter updated of room change.

## 2021-11-03 NOTE — Progress Notes (Signed)
Progress Note    Victor Copeland   [id number]:[id number]  DOB: 01/27/1956  DOA: 10/31/2021     3 PCP: Carlean Jews, NP  Initial CC: SOB, N/V  Hospital Course: [name] is a 65 yo Male with PMHx significant for 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   On arrival by EMS was found to be hypoxic and started on NRB before presenting to [facility]. In the ED was found to still be hypoxic in the low 80s and in respiratory distress and was intubated emergently. He was extubated on 11/7 and continued to have ongoing encephalopathy and concern for ongoing aspiration.  Multiple GOC discussions were held and family ultimately elected to transition to comfort care/hospice approach.   Interval History:  Daughter present bedside this am. Patient chronically ill in bed and extremely thin appearing; no distress when seen.   Assessment & Plan: Pneumonia of right lower lobe due to infectious organism - see sepsis   Sepsis (HCC) - Suspected aspiration pneumonia - Now transitioning to comfort care  Acute respiratory failure with hypoxia (HCC) - Suspected due to aspiration event - Extubated and patient now transitioned to DNR and comfort care approach - Continue oxygen for comfort  Hypoglycemia - Likely from insulin accumulation and poor intake - Continue weaning dextrose drip to off as CBGs tolerate  Dementia associated with other underlying disease with behavioral disturbance - currently comfortable and cooperative - continue comfort care measures   Protein-calorie malnutrition, severe - Patient's BMI is Body mass index is 18.94 kg/m.. - Patient has the following signs/symptoms consistent with PCM: (fat loss, muscle loss, muscle wasting, cachexia). - continue pleasure feeding in setting of hospice/comfort care   Acquired hypothyroidism - d/c synthroid in setting of above     Old  records reviewed in assessment of this patient   DVT prophylaxis: now comfort care  Code Status:   Code Status: DNR  Disposition Plan: Status is: Inpatient  Remains inpatient appropriate because: transitioning to comfort care    Objective: Blood pressure (!) 172/78, pulse 74, temperature 97.6 F (36.4 C), temperature source Oral, resp. rate 19, height 5' (1.524 m), weight 44 kg, SpO2 100 %.  Examination:  Physical Exam Constitutional:      Comments: Elderly appearing man, chronically ill laying in bed in NAD  HENT:     Head: Normocephalic and atraumatic.     Mouth/Throat:     Mouth: Mucous membranes are moist.  Eyes:     Extraocular Movements: Extraocular movements intact.  Cardiovascular:     Rate and Rhythm: Normal rate and regular rhythm.  Pulmonary:     Effort: No respiratory distress.     Breath sounds: Rhonchi present.  Abdominal:     General: Bowel sounds are normal. There is no distension.     Palpations: Abdomen is soft.     Tenderness: There is no abdominal tenderness.  Musculoskeletal:        General: Normal range of motion.     Cervical back: Normal range of motion and neck supple.  Skin:    General: Skin is warm and dry.  Neurological:     Comments: Contracted RUE; moving all 4 extremities in bed  Psychiatric:        Mood and Affect: Mood normal.        Behavior: Behavior normal.     Consultants:  Palliative Care   Procedures:  Data Reviewed: I have personally reviewed labs and imaging studies    LOS: 3 days  Time spent: Greater than 50% of the 35 minute visit was spent in counseling/coordination of care for the patient as laid out in the A&P.   Lewie Chamber, MD [facility] 11/03/2021, 2:48 PM

## 2021-11-03 NOTE — Assessment & Plan Note (Signed)
-   currently comfortable and cooperative - continue comfort care measures

## 2021-11-03 NOTE — TOC Progression Note (Signed)
Transition of Care [facility]) - Progression Note    Patient Details  Name: Victor Copeland MRN: [id number] Date of Birth: [birth year]  Transition of Care [facility]) CM/SW Contact  Ziyan Hillmer, Olegario Messier, RN Phone Number: 11/03/2021, 11:37 AM  Clinical Narrative: Await post Palliative care meeting-GOC. Continue to monitor for d/c plans.     Expected Discharge Plan: Skilled Nursing Facility Barriers to Discharge: Continued Medical Work up  Expected Discharge Plan and Services Expected Discharge Plan: Skilled Nursing Facility     Post Acute Care Choice: Skilled Nursing Facility Living arrangements for the past 2 months: Single Family Home                                       Social Determinants of Health (SDOH) Interventions    Readmission Risk Interventions No flowsheet data found.

## 2021-11-03 NOTE — Progress Notes (Signed)
PT Cancellation Note  Patient Details Name: Victor Copeland MRN: [id number] DOB: Mar 04, 1956   Cancelled Treatment:    Reason Eval/Treat Not Completed: Other (comment). Palliative Care meetin scheduled for today, defer at this time, pending GOC   [facility] 11/03/2021, 9:09 AM

## 2021-11-03 NOTE — Assessment & Plan Note (Addendum)
-   Likely from insulin accumulation and poor intake - responded well to IVF; okay to stop IVF at this time

## 2021-11-03 NOTE — Assessment & Plan Note (Signed)
-   Suspected aspiration pneumonia - Now transitioning to comfort care

## 2021-11-03 NOTE — Assessment & Plan Note (Signed)
-   d/c synthroid in setting of above

## 2021-11-03 NOTE — Assessment & Plan Note (Signed)
-   Patient's BMI is Body mass index is 18.94 kg/m.. - Patient has the following signs/symptoms consistent with PCM: (fat loss, muscle loss, muscle wasting, cachexia). - continue pleasure feeding in setting of hospice/comfort care

## 2021-11-04 ENCOUNTER — Other Ambulatory Visit: Payer: Self-pay

## 2021-11-04 DIAGNOSIS — R6520 Severe sepsis without septic shock: Secondary | ICD-10-CM

## 2021-11-04 LAB — GLUCOSE, CAPILLARY
Glucose-Capillary: 162 mg/dL — ABNORMAL HIGH (ref 70–99)
Glucose-Capillary: 174 mg/dL — ABNORMAL HIGH (ref 70–99)
Glucose-Capillary: 160 mg/dL — ABNORMAL HIGH (ref 70–99)
Glucose-Capillary: 151 mg/dL — ABNORMAL HIGH (ref 70–99)

## 2021-11-04 NOTE — Progress Notes (Addendum)
Progress Note    Victor Copeland   [id number]  DOB: 04-05-1956  DOA: 10/31/2021     4 PCP: Carlean Jews, NP  Initial CC: SOB, N/V  Hospital Course: Victor Copeland is a 65 yo Male with PMHx significant for 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   On arrival by EMS was found to be hypoxic and started on NRB before presenting to [facility]. In the ED was found to still be hypoxic in the low 80s and in respiratory distress and was intubated emergently. He was extubated on 11/7 and continued to have ongoing encephalopathy and concern for ongoing aspiration.  Multiple GOC discussions were held and family ultimately elected to transition to comfort care/hospice approach.   Interval History:  Family present this morning. No events overnight. He's resting in bed with eyes open and looking around. Appears comfortable. Family had no questions this morning.   Assessment & Plan: Pneumonia of right lower lobe due to infectious organism - see sepsis   Severe sepsis (HCC) - Suspected aspiration pneumonia - Now transitioning to comfort care  Acute respiratory failure with hypoxia (HCC) - Suspected due to aspiration event - Extubated and patient now transitioned to DNR and comfort care approach - Continue oxygen for comfort  Hypoglycemia - Likely from insulin accumulation and poor intake - responded well to IVF; okay to stop IVF at this time   Dementia associated with other underlying disease with behavioral disturbance - currently comfortable and cooperative - continue comfort care measures   Protein-calorie malnutrition, severe - Patient's BMI is Body mass index is 18.94 kg/m.. - Patient has the following signs/symptoms consistent with PCM: (fat loss, muscle loss, muscle wasting, cachexia). - continue pleasure feeding in setting of hospice/comfort care   Acquired  hypothyroidism - d/c synthroid in setting of above    Old records reviewed in assessment of this patient   DVT prophylaxis: now comfort care  Code Status:   Code Status: DNR  Disposition Plan: Status is: Inpatient   Objective: Blood pressure (!) 161/90, pulse 74, temperature 97.8 F (36.6 C), temperature source Oral, resp. rate 14, height 5' (1.524 m), weight 44 kg, SpO2 100 %.  Examination:  Physical Exam Constitutional:      Comments: Elderly appearing man, chronically ill laying in bed in NAD  HENT:     Head: Normocephalic and atraumatic.     Mouth/Throat:     Mouth: Mucous membranes are moist.  Eyes:     Extraocular Movements: Extraocular movements intact.  Cardiovascular:     Rate and Rhythm: Normal rate and regular rhythm.  Pulmonary:     Effort: No respiratory distress.     Breath sounds: Rhonchi present.  Abdominal:     General: Bowel sounds are normal. There is no distension.     Palpations: Abdomen is soft.     Tenderness: There is no abdominal tenderness.  Musculoskeletal:        General: Normal range of motion.     Cervical back: Normal range of motion and neck supple.  Skin:    General: Skin is warm and dry.  Neurological:     Comments: Contracted RUE; moving all 4 extremities in bed  Psychiatric:        Mood and Affect: Mood normal.        Behavior: Behavior normal.     Consultants:  Palliative Care   Procedures:  Data Reviewed: I have personally reviewed labs and imaging studies    LOS: 4 days  Time spent: Greater than 50% of the 35 minute visit was spent in counseling/coordination of care for the patient as laid out in the A&P.   Lewie Chamber, MD [facility] 11/04/2021, 2:49 PM

## 2021-11-04 NOTE — Progress Notes (Signed)
PT Cancellation Note  Patient Details Name: Victor Copeland MRN: [id number] DOB: June 16, 1956   Cancelled Treatment:     Per Rn, pt has moved to comfort care only.  PT service will sign off.   Jerren Flinchbaugh 11/04/2021, 9:43 AM

## 2021-11-04 NOTE — Plan of Care (Signed)
  Problem: Education: Goal: Knowledge of General Education information will improve Description: Including pain rating scale, medication(s)/side effects and non-pharmacologic comfort measures Outcome: Progressing   Problem: Safety: Goal: Ability to remain free from injury will improve Outcome: Progressing   Problem: Skin Integrity: Goal: Risk for impaired skin integrity will decrease Outcome: Progressing   

## 2021-11-05 LAB — CULTURE, BLOOD (ROUTINE X 2)
Culture: NO GROWTH
Culture: NO GROWTH
Special Requests: ADEQUATE
Special Requests: ADEQUATE

## 2021-11-05 LAB — GLUCOSE, CAPILLARY
Glucose-Capillary: 141 mg/dL — ABNORMAL HIGH (ref 70–99)
Glucose-Capillary: 117 mg/dL — ABNORMAL HIGH (ref 70–99)
Glucose-Capillary: 116 mg/dL — ABNORMAL HIGH (ref 70–99)
Glucose-Capillary: 113 mg/dL — ABNORMAL HIGH (ref 70–99)

## 2021-11-05 NOTE — Progress Notes (Signed)
Palliative Care  Progress Note  Mr. Raz is much less responsive this evening and sleeping most of the day. He is not maintaining his nutritional status and last evening required several PRN doses of morphine for agitation and distress. RN contacted me earlier in the day to relay concerns expressed by the family about his level of consciousness. I called and spoke with Robet Leu, the patient's daughter who has been the primary contact person and always available by phone. Today RN reports that Wally Shevchenko is requesting a call and there seems to not be complete understand among different family members about his goals of care. [name] understands clearly that her fathers condition is not reversible and that he is approaching end of life. She also knows what her fathers wishes were and agrees that the most compassionate way to care for him is with a focus on comfort. She is going to talk to the rest of her family this evening. She does want his symptoms managed with morphine and she is agreeable to a referral to a hospice house. Given his condition today, a hospice facility is the most appropriate level of care for him.  Anderson Malta, DO Palliative Medicine   Time: 35 min Greater than 50%  of this time was spent counseling and coordinating care related to the above assessment and plan.

## 2021-11-05 NOTE — Progress Notes (Signed)
Chinza, RN (6E) called and given report prior to transfer.

## 2021-11-05 NOTE — Plan of Care (Signed)

## 2021-11-05 NOTE — Plan of Care (Signed)
  Problem: Coping: Goal: Level of anxiety will decrease Outcome: Progressing   Problem: Pain Managment: Goal: General experience of comfort will improve Outcome: Progressing   

## 2021-11-05 NOTE — Progress Notes (Signed)
SLP Cancellation Note  Patient Details Name: Victor Copeland MRN: [id number] DOB: 02/04/1956   Cancelled treatment:       Reason Eval/Treat Not Completed: Other (comment) (pt full comfort at this time, will sign off)   Chales Abrahams 11/05/2021, 11:15 AM  Rolena Infante, MS [facility] SLP Acute Rehab Services Office [phone] Pager [phone]

## 2021-11-05 NOTE — Progress Notes (Signed)
Daughter [name]) called made aware of the patient being transferred to 6E and acknowledged.

## 2021-11-05 NOTE — Progress Notes (Signed)
Progress Note    Laden Fieldhouse   [id number]:[id number]  DOB: 11-09-1956  DOA: 10/31/2021     5 PCP: Carlean Jews, NP  Initial CC: SOB, N/V  Hospital Course: [name] is a 65 yo Male with PMHx significant for 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   On arrival by EMS was found to be hypoxic and started on NRB before presenting to [facility]. In the ED was found to still be hypoxic in the low 80s and in respiratory distress and was intubated emergently. He was extubated on 11/7 and continued to have ongoing encephalopathy and concern for ongoing aspiration.  Multiple GOC discussions were held and family ultimately elected to transition to comfort care/hospice approach.   Interval History:  No events overnight.  No family present this morning.  He was noted to be resting in bed comfortably lying on his left side.  Assessment & Plan: Pneumonia of right lower lobe due to infectious organism - see sepsis   Severe sepsis (HCC) - Suspected aspiration pneumonia - Now transitioning to comfort care  Acute respiratory failure with hypoxia (HCC) - Suspected due to aspiration event - Extubated and patient now transitioned to DNR and comfort care approach - Continue oxygen for comfort  Dementia associated with other underlying disease with behavioral disturbance - currently comfortable and cooperative - continue comfort care measures   Hypoglycemia-resolved as of 11/05/2021 - Likely from insulin accumulation and poor intake - responded well to IVF; okay to stop IVF at this time   Protein-calorie malnutrition, severe - Patient's BMI is Body mass index is 18.94 kg/m.. - Patient has the following signs/symptoms consistent with PCM: (fat loss, muscle loss, muscle wasting, cachexia). - continue pleasure feeding in setting of hospice/comfort care   Acquired hypothyroidism - d/c  synthroid in setting of above     Old records reviewed in assessment of this patient   DVT prophylaxis: now comfort care  Code Status:   Code Status: DNR  Disposition Plan: Status is: Inpatient   Objective: Blood pressure (!) 158/72, pulse 72, temperature 97.9 F (36.6 C), temperature source Axillary, resp. rate 14, height 5' (1.524 m), weight 44 kg, SpO2 97 %.  Examination:  Physical Exam Constitutional:      Comments: Elderly appearing man, chronically ill laying in bed in NAD  HENT:     Head: Normocephalic and atraumatic.     Mouth/Throat:     Mouth: Mucous membranes are moist.  Eyes:     Extraocular Movements: Extraocular movements intact.  Cardiovascular:     Rate and Rhythm: Normal rate and regular rhythm.  Pulmonary:     Effort: No respiratory distress.     Breath sounds: Rhonchi present.  Abdominal:     General: Bowel sounds are normal. There is no distension.     Palpations: Abdomen is soft.     Tenderness: There is no abdominal tenderness.  Musculoskeletal:        General: Normal range of motion.     Cervical back: Normal range of motion and neck supple.  Skin:    General: Skin is warm and dry.  Neurological:     Comments: Contracted RUE; moving all 4 extremities in bed  Psychiatric:        Mood and Affect: Mood normal.        Behavior: Behavior normal.     Consultants:  Palliative Care  Procedures:    Data Reviewed: I have personally reviewed labs and imaging studies    LOS: 5 days  Time spent: Greater than 50% of the 35 minute visit was spent in counseling/coordination of care for the patient as laid out in the A&P.   Lewie Chamber, MD [facility] 11/05/2021, 3:06 PM

## 2021-11-06 NOTE — Progress Notes (Signed)
[facility]) Hospital Liaison Note  Received request from Transitions of Care Manager Laqueta Due, LCSW for family interest in [facility]. Spoke with daughter Edwar Coe to confirm interest and explain services.  Approval for [facility] is determined by [facility] MD. Once [facility] MD has determined [facility] eligibility, [facility] will update hospital staff and family.  Please do not hesitate to call with any hospice related questions.    Thank you for the opportunity to participate in this patient's care.   Bobbie "Einar Gip, RN, BSN [facility] Liaison [phone]

## 2021-11-06 NOTE — Progress Notes (Signed)
[facility]) Hospital Liaison Note   Patient chart and information reviewed by [facility] physician. [facility] eligibility confirmed.    Family agreeable to transfer today. Christina Christovale, LCSW TOC Manager aware.   RN please call report to [facility] at [phone] prior to patient leaving the unit.  Please send signed and completed DNR with patient at discharge.   Please do not hesitate to call with any hospice related questions.    Thank you for the opportunity to participate in this patient's care.   Bobbie "Einar Gip, RN, BSN [facility] Liaison [phone]

## 2021-11-06 NOTE — Progress Notes (Signed)
Patient discharged to [facility], report called to [facility]. Transport with [facility].

## 2021-11-06 NOTE — TOC Transition Note (Signed)
Transition of Care [facility]) - CM/SW Discharge Note   Patient Details  Name: Victor Copeland MRN: [id number] Date of Birth: 08/05/1956  Transition of Care [facility]) CM/SW Contact:  Larrie Kass, LCSW Phone Number: 11/06/2021, 2:16 PM   Clinical Narrative:    Patient will be discharging to [facility]. [facility] called for transport.     Final next level of care: Skilled Nursing Facility Barriers to Discharge: Continued Medical Work up   Patient Goals and CMS Choice Patient states their goals for this hospitalization and ongoing recovery are:: To go to SNF for rehab.      Discharge Placement                       Discharge Plan and Services     Post Acute Care Choice: Skilled Nursing Facility                               Social Determinants of Health (SDOH) Interventions     Readmission Risk Interventions No flowsheet data found.

## 2021-11-06 NOTE — TOC Progression Note (Signed)
Transition of Care [facility]) - Progression Note    Patient Details  Name: Victor Copeland MRN: [id number] Date of Birth: 07/17/1956  Transition of Care [facility]) CM/SW Contact  Larrie Kass, LCSW Phone Number: 11/06/2021, 9:56 AM  Clinical Narrative:    TOC CSW spoke with pt's daughter [name] [phone])  inquiring about their preferred hospice agency, she stated wanting something close to [place]. CSW informed pt about [facility] and pt's daughter agreed to [employer]. CSW spoke with  Boneta Lucks from [place], who stated she will speak with the family.     Expected Discharge Plan: Skilled Nursing Facility Barriers to Discharge: Continued Medical Work up  Expected Discharge Plan and Services Expected Discharge Plan: Skilled Nursing Facility     Post Acute Care Choice: Skilled Nursing Facility Living arrangements for the past 2 months: Single Family Home                                       Social Determinants of Health (SDOH) Interventions    Readmission Risk Interventions No flowsheet data found.

## 2021-11-06 NOTE — Discharge Summary (Signed)
Physician Discharge Summary   Patient name: Victor Copeland  Admit date:     10/31/2021  Discharge date: 11/06/2021  Discharge Physician: Lewie Chamber   PCP: Carlean Jews, NP   Recommendations at discharge: Continue comfort measures; discharging to [facility]  Discharge Diagnoses Active Problems:   Acute respiratory failure with hypoxia (HCC)   Pneumonia of right lower lobe due to infectious organism   Dementia associated with other underlying disease with behavioral disturbance   Protein-calorie malnutrition, severe   Acquired hypothyroidism   Resolved Diagnoses Resolved Problems:   Severe sepsis [facility])   Hypoglycemia   Hospital Course   Victor Copeland is a 65 yo Male with PMHx significant for 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   On arrival by EMS was found to be hypoxic and started on NRB before presenting to [facility]. In the ED was found to still be hypoxic in the low 80s and in respiratory distress and was intubated emergently. He was extubated on 11/7 and continued to have ongoing encephalopathy and concern for ongoing aspiration.  Multiple GOC discussions were held and family ultimately elected to transition to comfort care/hospice approach.    Pneumonia of right lower lobe due to infectious organism - see sepsis   Acute respiratory failure with hypoxia (HCC) - Suspected due to aspiration event - Extubated and patient now transitioned to DNR and comfort care approach - Continue oxygen for comfort  Severe sepsis (HCC)-resolved as of 11/06/2021 - Suspected aspiration pneumonia - Now transitioning to comfort care  Dementia associated with other underlying disease with behavioral disturbance - currently comfortable and cooperative - continue comfort care measures   Hypoglycemia-resolved as of 11/05/2021 - Likely from insulin accumulation and poor  intake - responded well to IVF; okay to stop IVF at this time   Protein-calorie malnutrition, severe - Patient's BMI is Body mass index is 18.94 kg/m.. - Patient has the following signs/symptoms consistent with PCM: (fat loss, muscle loss, muscle wasting, cachexia). - continue pleasure feeding in setting of hospice/comfort care   Acquired hypothyroidism - d/c synthroid in setting of above      Condition at discharge: poor  Exam Physical Exam Constitutional:      Comments: Elderly appearing man, chronically ill laying in bed in NAD  HENT:     Head: Normocephalic and atraumatic.     Mouth/Throat:     Mouth: Mucous membranes are moist.  Eyes:     Extraocular Movements: Extraocular movements intact.  Cardiovascular:     Rate and Rhythm: Normal rate and regular rhythm.  Pulmonary:     Effort: No respiratory distress.     Breath sounds: Rhonchi present.  Abdominal:     General: Bowel sounds are normal. There is no distension.     Palpations: Abdomen is soft.     Tenderness: There is no abdominal tenderness.  Musculoskeletal:        General: Normal range of motion.     Cervical back: Normal range of motion and neck supple.  Skin:    General: Skin is warm and dry.  Neurological:     Comments: Contracted RUE; moving all 4 extremities in bed  Psychiatric:        Mood and Affect: Mood normal.        Behavior: Behavior normal.     Disposition: Hospice care  Discharge time: greater than 30 minutes.   Allergies  as of 11/06/2021   No Known Allergies      Medication List     STOP taking these medications    atorvastatin 40 MG tablet Commonly known as: LIPITOR   empagliflozin 25 MG Tabs tablet Commonly known as: Jardiance   levothyroxine 50 MCG tablet Commonly known as: SYNTHROID   losartan 50 MG tablet Commonly known as: COZAAR   metFORMIN 1000 MG tablet Commonly known as: GLUCOPHAGE   metoprolol tartrate 25 MG tablet Commonly known as: LOPRESSOR    mirtazapine 7.5 MG tablet Commonly known as: REMERON   QUEtiapine 25 MG tablet Commonly known as: SEROquel        CT HEAD WO CONTRAST ( )  Result Date: 10/31/2021 CLINICAL DATA:  Mental status change, persistent or worsening altered mental status. Nausea and vomiting. History of CVA. EXAM: CT HEAD WITHOUT CONTRAST TECHNIQUE: Contiguous axial images were obtained from the base of the skull through the vertex without intravenous contrast. COMPARISON:  None. FINDINGS: Brain: Nonspecific moderate subcortical and periventricular white matter hypodensity, most in keeping with chronic small vessel ischemic change. No evidence of parenchymal hemorrhage or extra-axial fluid collection. No mass lesion, mass effect, or midline shift. No CT evidence of acute infarction. Generalized mild cerebral volume loss. Dilatation of the lateral ventricles bilaterally, slightly greater on the left. Vascular: No acute abnormality. Skull: No evidence of calvarial fracture. Sinuses/Orbits: No fluid levels. Minimal mucoperiosteal thickening in the maxillary sinuses. Other:  The mastoid air cells are unopacified. IMPRESSION: 1. No evidence of acute intracranial abnormality. 2. Dilatation of the lateral ventricles bilaterally, slightly greater on the left, nonspecific, favored to be due to central cerebral atrophy. Correlation with any prior imaging would be useful. 3. Moderate chronic small vessel ischemic changes in the cerebral white matter. 4. Minimal chronic appearing paranasal sinusitis. Electronically Signed   By: Delbert Phenix M.D.   On: 10/31/2021 08:27   MR BRAIN WO CONTRAST  Result Date: 10/31/2021 CLINICAL DATA:  Anisocoria.  History of stroke. EXAM: MRI HEAD WITHOUT CONTRAST TECHNIQUE: Multiplanar, multiecho pulse sequences of the brain and surrounding structures were obtained without intravenous contrast. COMPARISON:  Head CT 10/31/2021 FINDINGS: Brain: There is no evidence of an acute infarct, intracranial  hemorrhage, mass, midline shift, or extra-axial fluid collection. There is moderate dilatation of the left greater than right lateral ventricles and third ventricle including bilateral temporal horn dilatation with only slight enlargement of the cerebral sulci. The fourth ventricle is not significantly dilated. Patchy and confluent T2 hyperintensities in the cerebral white matter bilaterally are nonspecific but may reflect moderate chronic small vessel ischemic disease. There is a chronic left parietal infarct with ex vacuo dilatation of the lateral ventricle on this region. A rim of T2 hyperintensity is present along the lateral ventricles diffusely including in the temporal lobes which raises the possibility of mild transependymal CSF flow in the setting of hydrocephalus. There is bilateral mesial temporal lobe volume loss. Vascular: Major intracranial vascular flow voids are preserved. Skull and upper cervical spine: No suspicious marrow lesion. Sinuses/Orbits: Unremarkable orbits. Paranasal sinuses and mastoid air cells are clear. Other: None. IMPRESSION: 1. No acute infarct. 2. Moderate ventriculomegaly which may reflect central predominant cerebral atrophy or hydrocephalus. 3. Moderate chronic small vessel ischemic disease. Electronically Signed   By: Sebastian Ache M.D.   On: 10/31/2021 19:19   DG Chest Port 1 View  Result Date: 10/31/2021 CLINICAL DATA:  Possible sepsis EXAM: PORTABLE CHEST 1 VIEW COMPARISON:  04/10/2021 FINDINGS: Endotracheal tube and gastric catheter are  noted in satisfactory position. Cardiac shadow is within normal limits. Lungs are well aerated bilaterally. Skin folds are noted over the left chest. Patchy infiltrate is noted particularly in the right lung base. No bony abnormality is noted. IMPRESSION: Right basilar infiltrate. Tubes and lines in satisfactory position. Electronically Signed   By: Alcide Clever M.D.   On: 10/31/2021 02:27   Results for orders placed or performed during  the hospital encounter of 10/31/21  Resp Panel by RT-PCR (Flu A&B, Covid)     Status: None   Collection Time: 10/31/21  2:00 AM  Result Value Ref Range Status   SARS Coronavirus 2 by RT PCR NEGATIVE NEGATIVE Final    Comment: (NOTE) SARS-CoV-2 target nucleic acids are NOT DETECTED.  The SARS-CoV-2 RNA is generally detectable in upper respiratory specimens during the acute phase of infection. The lowest concentration of SARS-CoV-2 viral copies this assay can detect is 138 copies/mL. A negative result does not preclude SARS-Cov-2 infection and should not be used as the sole basis for treatment or other patient management decisions. A negative result may occur with  improper specimen collection/handling, submission of specimen other than nasopharyngeal swab, presence of viral mutation(s) within the areas targeted by this assay, and inadequate number of viral copies(<138 copies/mL). A negative result must be combined with clinical observations, patient history, and epidemiological information. The expected result is Negative.  Fact Sheet for Patients:  BloggerCourse.com  Fact Sheet for Healthcare Providers:  SeriousBroker.it  This test is no t yet approved or cleared by the [place] FDA and  has been authorized for detection and/or diagnosis of SARS-CoV-2 by FDA under an Emergency Use Authorization (EUA). This EUA will remain  in effect (meaning this test can be used) for the duration of the COVID-19 declaration under Section 564(b)(1) of the Act, 21 U.S.C.section 360bbb-3(b)(1), unless the authorization is terminated  or revoked sooner.       Influenza A by PCR NEGATIVE NEGATIVE Final   Influenza B by PCR NEGATIVE NEGATIVE Final    Comment: (NOTE) The Xpert Xpress SARS-CoV-2/FLU/RSV plus assay is intended as an aid in the diagnosis of influenza from Nasopharyngeal swab specimens and should not be used as a sole basis for  treatment. Nasal washings and aspirates are unacceptable for Xpert Xpress SARS-CoV-2/FLU/RSV testing.  Fact Sheet for Patients: BloggerCourse.com  Fact Sheet for Healthcare Providers: SeriousBroker.it  This test is not yet approved or cleared by the [place] FDA and has been authorized for detection and/or diagnosis of SARS-CoV-2 by FDA under an Emergency Use Authorization (EUA). This EUA will remain in effect (meaning this test can be used) for the duration of the COVID-19 declaration under Section 564(b)(1) of the Act, 21 U.S.C. section 360bbb-3(b)(1), unless the authorization is terminated or revoked.  Performed at [facility], [address] [place], [place] [address]   Blood Culture (routine x 2)     Status: None   Collection Time: 10/31/21  2:10 AM   Specimen: BLOOD  Result Value Ref Range Status   Specimen Description   Final    BLOOD RIGHT ANTECUBITAL Performed at [facility], [address], [place], [place] [address]    Special Requests   Final    BOTTLES DRAWN AEROBIC AND ANAEROBIC Blood Culture adequate volume Performed at [facility], [address] [place], [place] [address]    Culture   Final    NO GROWTH 5 DAYS Performed at [facility], [address] [place], [place] [address]    Report Status 11/05/2021 FINAL  Final  Urine Culture     Status: None   Collection Time: 10/31/21  2:10 AM   Specimen: In/Out Cath Urine  Result Value Ref Range Status   Specimen Description   Final    IN/OUT CATH URINE Performed at [facility], [address] [place], [place] [address]    Special Requests   Final    NONE Performed at [facility], [address], [place], [place] [address]    Culture   Final    NO GROWTH Performed at [facility], [address], [place], [place] [address]    Report  Status 11/01/2021 FINAL  Final  Blood Culture (routine x 2)     Status: None   Collection Time: 10/31/21  2:15 AM   Specimen: BLOOD  Result Value Ref Range Status   Specimen Description   Final    BLOOD LEFT ANTECUBITAL Performed at [facility], [address] [place], [place] [address]    Special Requests   Final    BOTTLES DRAWN AEROBIC AND ANAEROBIC Blood Culture adequate volume Performed at [facility], [address], [place], [place] [address]    Culture   Final    NO GROWTH 5 DAYS Performed at [facility], [address], [place], [place] [address]    Report Status 11/05/2021 FINAL  Final  MRSA Next Gen by PCR, Nasal     Status: None   Collection Time: 10/31/21  6:30 AM   Specimen: Nasal Mucosa; Nasal Swab  Result Value Ref Range Status   MRSA by PCR Next Gen NOT DETECTED NOT DETECTED Final    Comment: (NOTE) The GeneXpert MRSA Assay (FDA approved for NASAL specimens only), is one component of a comprehensive MRSA colonization surveillance program. It is not intended to diagnose MRSA infection nor to guide or monitor treatment for MRSA infections. Test performance is not FDA approved in patients less than 21 years old. Performed at [facility], [address] [place], [place] [address]     Signed:  Lewie Chamber MD.  [facility] 11/06/2021, 2:29 PM

## 2021-11-25 DEATH — deceased

## 2022-02-16 IMAGING — DX DG CHEST 1V PORT
1 series · 1 of 1 positions shown · non-contrast
Comparison: 04/10/2021

CLINICAL DATA: Possible sepsis

EXAM:
PORTABLE CHEST 1 VIEW

[chest ap]
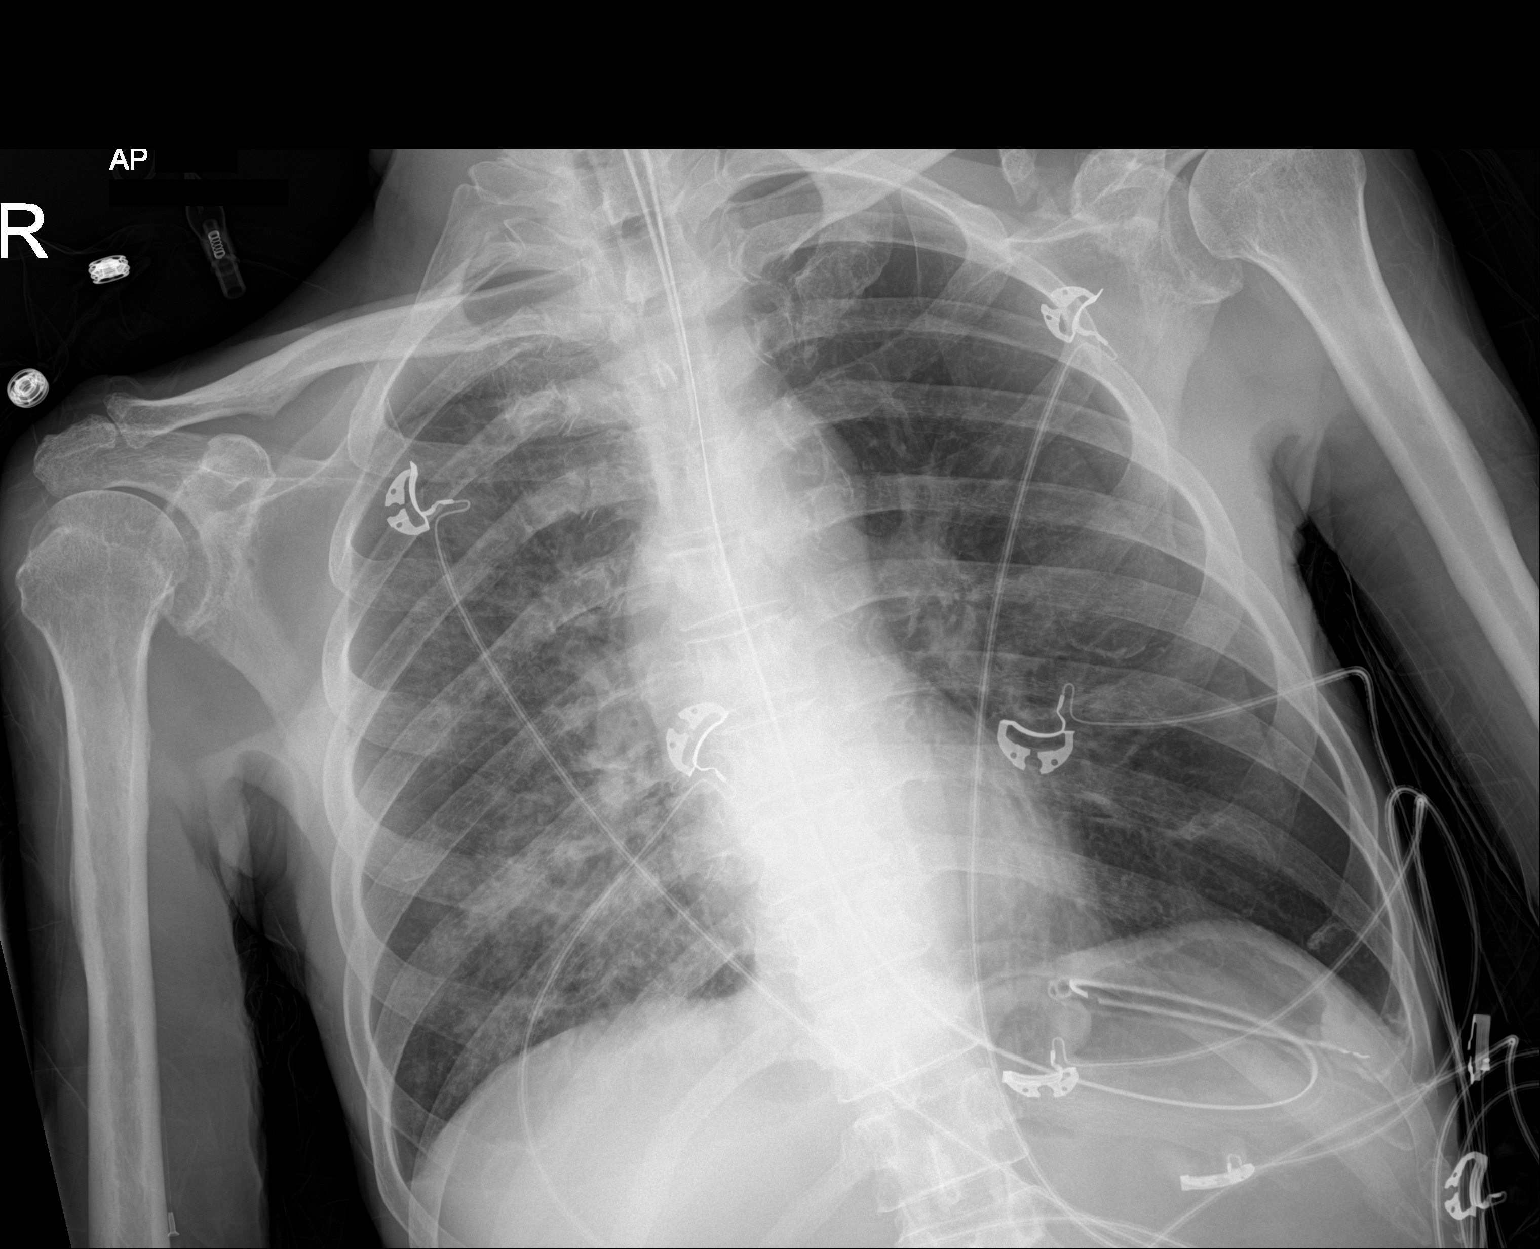

[1 of 1 positions shown; findings below may reference images not displayed]

FINDINGS: Endotracheal tube and gastric catheter are noted in satisfactory
position. Cardiac shadow is within normal limits. Lungs are well
aerated bilaterally. Skin folds are noted over the left chest.
Patchy infiltrate is noted particularly in the right lung base. No
bony abnormality is noted.
IMPRESSION: Right basilar infiltrate.

Tubes and lines in satisfactory position.
